# Patient Record
Sex: Female | Born: 2000 | Race: White | Hispanic: No | Marital: Married | Smoking: Never smoker
Health system: Southern US, Community
[De-identification: ages and names within clinical notes are randomized; demographics above are authoritative.]

## PROBLEM LIST (undated history)

## (undated) DIAGNOSIS — Z789 Other specified health status: Secondary | ICD-10-CM

## (undated) HISTORY — PX: APPENDECTOMY: SHX54

---

## 2019-04-29 ENCOUNTER — Encounter (HOSPITAL_COMMUNITY): Payer: Self-pay | Admitting: *Deleted

## 2019-04-29 ENCOUNTER — Other Ambulatory Visit: Payer: Self-pay

## 2019-04-29 ENCOUNTER — Inpatient Hospital Stay (HOSPITAL_COMMUNITY)
Admission: AD | Admit: 2019-04-29 | Discharge: 2019-04-29 | Disposition: A | Discharge status: Home / Self Care | Attending: Obstetrics & Gynecology | Admitting: Obstetrics & Gynecology

## 2019-04-29 DIAGNOSIS — Z3A08 8 weeks gestation of pregnancy: Secondary | ICD-10-CM | POA: Diagnosis not present

## 2019-04-29 DIAGNOSIS — O219 Vomiting of pregnancy, unspecified: Secondary | ICD-10-CM | POA: Insufficient documentation

## 2019-04-29 HISTORY — DX: Other specified health status: Z78.9

## 2019-04-29 LAB — URINALYSIS, ROUTINE W REFLEX MICROSCOPIC
Bacteria, UA: NONE SEEN
Bilirubin Urine: NEGATIVE
Glucose, UA: NEGATIVE mg/dL
Hgb urine dipstick: NEGATIVE
Ketones, ur: 80 mg/dL — AB
Leukocytes,Ua: NEGATIVE
Nitrite: NEGATIVE
Protein, ur: 30 mg/dL — AB
Specific Gravity, Urine: 1.018 (ref 1.005–1.030)
pH: 7 (ref 5.0–8.0)

## 2019-04-29 LAB — POCT PREGNANCY, URINE: Preg Test, Ur: POSITIVE — AB

## 2019-04-29 MED ORDER — METOCLOPRAMIDE HCL 10 MG PO TABS: 10.0000 mg | ORAL_TABLET | Freq: Four times a day (QID) | ORAL | 1 refills | Status: DC | PRN

## 2019-04-29 MED ORDER — SCOPOLAMINE 1 MG/3DAYS TD PT72: 1.0000 | MEDICATED_PATCH | TRANSDERMAL | 0 refills | Status: DC

## 2019-04-29 MED ORDER — M.V.I. ADULT IV INJ
Freq: Once | INTRAVENOUS | Status: AC
  Administered 2019-04-29: 16:00:00 via INTRAVENOUS
  Filled 2019-04-29: qty 10

## 2019-04-29 MED ORDER — METOCLOPRAMIDE HCL 5 MG/ML IJ SOLN
10.0000 mg | Freq: Once | INTRAMUSCULAR | Status: AC
  Administered 2019-04-29: 10 mg via INTRAVENOUS
  Filled 2019-04-29: qty 2

## 2019-04-29 MED ORDER — FAMOTIDINE IN NACL 20-0.9 MG/50ML-% IV SOLN
20.0000 mg | Freq: Once | INTRAVENOUS | Status: AC
  Administered 2019-04-29: 20 mg via INTRAVENOUS
  Filled 2019-04-29: qty 50

## 2019-04-29 MED ORDER — LACTATED RINGERS IV BOLUS
1000.0000 mL | Freq: Once | INTRAVENOUS | Status: AC
  Administered 2019-04-29: 1000 mL via INTRAVENOUS

## 2019-04-29 MED ORDER — SCOPOLAMINE 1 MG/3DAYS TD PT72
1.0000 | MEDICATED_PATCH | TRANSDERMAL | Status: DC
  Administered 2019-04-29: 1.5 mg via TRANSDERMAL
  Filled 2019-04-29: qty 1

## 2019-04-29 NOTE — Discharge Instructions (Signed)
[place] for [employer] at [facility]       Phone: [phone]  [place] for [employer] at [place]   Phone: [place] for [employer] at [place]  Phone: [place] for [place] at [employer]  Phone: [place] for [place] at [place]  Phone: [place] for [place] at [facility]   Phone: [facility]       Phone: [phone]  [facility]    Phone: [facility]    Phone: [facility]    Phone: [place]    Phone: [phone]  [place] [facility]       Phone: [phone]   [place] [facility]  Phone: [place]    Phone: [phone]  [facility]   Phone: [phone]  [facility]      Phone: [phone]  [facility]    Phone: [phone]  Safe Medications in Pregnancy   Acne: Benzoyl Peroxide Salicylic Acid  Backache/Headache: Tylenol: 2 regular strength every 4 hours OR              2 Extra strength every 6 hours  Colds/Coughs/Allergies: Benadryl (alcohol free) 25 mg every 6 hours as needed Breath right strips Claritin Cepacol throat lozenges Chloraseptic throat spray Cold-Eeze- up to three times per day Cough drops, alcohol free Flonase (by prescription only) Guaifenesin Mucinex Robitussin DM (plain only, alcohol free) Saline nasal spray/drops Sudafed (pseudoephedrine) & Actifed ** use only after [redacted] weeks gestation and if you do not have high blood pressure Tylenol Vicks Vaporub Zinc lozenges Zyrtec   Constipation: Colace Ducolax suppositories Fleet enema Glycerin suppositories Metamucil Milk of  magnesia Miralax Senokot Smooth move tea  Diarrhea: Kaopectate Imodium A-D  *NO pepto Bismol  Hemorrhoids: Anusol Anusol HC Preparation H Tucks  Indigestion: Tums Maalox Mylanta Zantac  Pepcid  Insomnia: Benadryl (alcohol free) 25mg  every 6 hours as needed Tylenol PM Unisom, no Gelcaps  Leg Cramps: Tums MagGel  Nausea/Vomiting:  Bonine Dramamine Emetrol Ginger extract Sea bands Meclizine  Nausea medication to take during pregnancy:  Unisom (doxylamine succinate 25 mg tablets) Take one tablet daily at bedtime. If symptoms are not adequately controlled, the dose can be increased to a maximum recommended dose of two tablets daily (1/2 tablet in the morning, 1/2 tablet mid-afternoon and one at bedtime). Vitamin B6 100mg  tablets. Take one tablet twice a day (up to 200 mg per day).  Skin Rashes: Aveeno products Benadryl cream or 25mg  every 6 hours as needed Calamine Lotion 1% cortisone cream  Yeast infection: Gyne-lotrimin 7 Monistat 7   **If taking multiple medications, please check labels to avoid duplicating the same active ingredients **take medication as directed on the label ** Do not exceed 4000 mg of tylenol in 24 hours **Do not take medications that contain aspirin or ibuprofen

## 2019-04-29 NOTE — MAU Provider Note (Addendum)
History     CSN: [id number]  Arrival date and time: 04/29/19 1309   First Provider Initiated Contact with Patient 04/29/19 1410      Chief Complaint  Patient presents with  . Nausea  . Emesis   Jordan Murray is a 18 y.o. G1P0 at unknown GA. She presents to MAU with complaints of N/V. She reports symptoms have been occurring for the past 1.5 weeks. She reports being unable to keep fluid or food down. Was seen at another hospital last week and told she may be around [redacted] weeks gestation- she is unsure of her LMP. They prescribed zofran and reglan for N/V but she ran out of medication. She reports reglan worked but zofran did not. She denies abdominal pain, vaginal bleeding or vaginal discharge.    OB History   No obstetric history on file.     Past Medical History:  Diagnosis Date  . Medical history non-contributory     Past Surgical History:  Procedure Laterality Date  . APPENDECTOMY      History reviewed. No pertinent family history.  Social History   Tobacco Use  . Smoking status: Never Smoker  . Smokeless tobacco: Former Engineer, water Use Topics  . Alcohol use: Not Currently  . Drug use: Not Currently    Allergies: No Known Allergies  No medications prior to admission.    Review of Systems  Constitutional: Negative.   Respiratory: Negative.   Cardiovascular: Negative.   Gastrointestinal: Positive for nausea and vomiting. Negative for abdominal pain, constipation and diarrhea.  Genitourinary: Negative.   Musculoskeletal: Negative.   Neurological: Negative.    Physical Exam   Blood pressure 121/84, pulse (!) 107, temperature 98.1 F (36.7 C), temperature source Oral, resp. rate 16, height 5\' 4"  (1.626 m), weight 61.6 kg, SpO2 98 %.  Physical Exam  Nursing note and vitals reviewed. Constitutional: She is oriented to person, place, and time. She appears well-developed and well-nourished. No distress.  Cardiovascular: Normal rate, regular rhythm and  normal heart sounds.  Respiratory: Effort normal and breath sounds normal. No respiratory distress. She has no wheezes.  GI: Soft. She exhibits no distension. There is no abdominal tenderness. There is no rebound and no guarding.  Musculoskeletal: Normal range of motion.        General: No edema.  Neurological: She is alert and oriented to person, place, and time.  Skin: Skin is warm and dry.  Psychiatric: She has a normal mood and affect. Her behavior is normal. Thought content normal.   MAU Course  Procedures  MDM Orders Placed This Encounter  Procedures  . Urinalysis, Routine w reflex microscopic  . Pregnancy, urine POC  . Insert peripheral IV   Meds ordered this encounter  Medications  . lactated ringers bolus 1,000 mL  . multivitamins adult (INFUVITE ADULT) 10 mL in lactated ringers 1,000 mL infusion  . scopolamine (TRANSDERM-SCOP) 1 MG/3DAYS 1.5 mg  . metoCLOPramide (REGLAN) injection 10 mg  . famotidine (PEPCID) IVPB 20 mg premix   Labs reviewed:  Results for orders placed or performed during the hospital encounter of 04/29/19 (from the past 24 hour(s))  Urinalysis, Routine w reflex microscopic     Status: Abnormal   Collection Time: 04/29/19  1:50 PM  Result Value Ref Range   Color, Urine AMBER (A) YELLOW   APPearance CLOUDY (A) CLEAR   Specific Gravity, Urine 1.018 1.005 - 1.030   pH 7.0 5.0 - 8.0   Glucose, UA NEGATIVE NEGATIVE mg/dL  Hgb urine dipstick NEGATIVE NEGATIVE   Bilirubin Urine NEGATIVE NEGATIVE   Ketones, ur 80 (A) NEGATIVE mg/dL   Protein, ur 30 (A) NEGATIVE mg/dL   Nitrite NEGATIVE NEGATIVE   Leukocytes,Ua NEGATIVE NEGATIVE   RBC / HPF 0-5 0 - 5 RBC/hpf   Bacteria, UA NONE SEEN NONE SEEN   Squamous Epithelial / LPF 6-10 0 - 5   Mucus PRESENT   Pregnancy, urine POC     Status: Abnormal   Collection Time: 04/29/19  1:50 PM  Result Value Ref Range   Preg Test, Ur POSITIVE (A) NEGATIVE   Treatments in MAU included IV LR bolus, pepcid IV, reglan  and banana bag IV. Scope patch also placed.   PO challenge passed after completion of treatment in MAU  Bedside US performed:  Pt informed that the ultrasound is considered a limited OB ultrasound and is not intended to be a complete ultrasound exam.  Patient also informed that the ultrasound is not being completed with the intent of assessing for fetal or placental anomalies or any pelvic abnormalities.  Explained that the purpose of today's ultrasound is to assess for  viability .  Patient acknowledges the purpose of the exam and the limitations of the study.    FHR 160 by bedside US  CRL 1.63 measuring [redacted]w[redacted]d   Rx for reglan and scope patch sent to pharmacy of choice, Discussed reasons to return to MAU. Encouraged to make appointment for initial prenatal care. Pt stable at time of discharge.   Assessment and Plan   1. Nausea and vomiting during pregnancy prior to [redacted] weeks gestation    Discharge home Make initial prenatal appointment Return to MAU as needed Rx for Reglan and Scope patch     Allergies as of 04/29/2019   No Known Allergies     Medication List    TAKE these medications   metoCLOPramide 10 MG tablet Commonly known as: REGLAN Take 1 tablet (10 mg total) by mouth every 6 (six) hours as needed for nausea.   scopolamine 1 MG/3DAYS Commonly known as: TRANSDERM-SCOP Place 1 patch (1.5 mg total) onto the skin every 3 (three) days. Start taking on: May 02, 2019       Lajean Manes [facility] 04/29/2019, 8:10 PM

## 2019-04-29 NOTE — MAU Note (Signed)
Jordan Murray is a 18 y.o. here in MAU reporting: + UPT last week at [facility]. States she has been to the hospital twice for dehydration. States she cannot keep anything. Was given zofran and reglan for nausea but is out of those medications. Unable to count the number of times she has thrown up in the past 24 hours. Has not started [facility] yet. No abdominal pain, vaginal bleeding, or discharge.   LMP: unknown, states she was on the 3 month OCP and did not get a period  Onset of complaint: ongoing  Pain score: 0/10  Vitals:   04/29/19 1342  BP: 121/84  Pulse: (!) 107  Resp: 16  Temp: 98.1 F (36.7 C)  SpO2: 98%      Lab orders placed from triage: UA, UPT

## 2019-05-23 ENCOUNTER — Encounter (HOSPITAL_COMMUNITY): Payer: Self-pay | Admitting: *Deleted

## 2019-05-23 ENCOUNTER — Inpatient Hospital Stay (HOSPITAL_COMMUNITY)

## 2019-05-23 ENCOUNTER — Inpatient Hospital Stay (HOSPITAL_COMMUNITY)
Admission: AD | Admit: 2019-05-23 | Discharge: 2019-05-26 | DRG: 833 | Disposition: A | Discharge status: Home / Self Care | Attending: Obstetrics & Gynecology | Admitting: Obstetrics & Gynecology

## 2019-05-23 ENCOUNTER — Other Ambulatory Visit: Payer: Self-pay

## 2019-05-23 DIAGNOSIS — O211 Hyperemesis gravidarum with metabolic disturbance: Secondary | ICD-10-CM | POA: Diagnosis present

## 2019-05-23 DIAGNOSIS — Z20828 Contact with and (suspected) exposure to other viral communicable diseases: Secondary | ICD-10-CM | POA: Diagnosis present

## 2019-05-23 DIAGNOSIS — Z3A11 11 weeks gestation of pregnancy: Secondary | ICD-10-CM | POA: Diagnosis not present

## 2019-05-23 DIAGNOSIS — O283 Abnormal ultrasonic finding on antenatal screening of mother: Secondary | ICD-10-CM

## 2019-05-23 DIAGNOSIS — E860 Dehydration: Secondary | ICD-10-CM | POA: Diagnosis present

## 2019-05-23 DIAGNOSIS — E871 Hypo-osmolality and hyponatremia: Secondary | ICD-10-CM | POA: Diagnosis present

## 2019-05-23 DIAGNOSIS — O210 Mild hyperemesis gravidarum: Secondary | ICD-10-CM | POA: Diagnosis present

## 2019-05-23 DIAGNOSIS — E876 Hypokalemia: Secondary | ICD-10-CM | POA: Diagnosis present

## 2019-05-23 DIAGNOSIS — R17 Unspecified jaundice: Secondary | ICD-10-CM | POA: Diagnosis present

## 2019-05-23 DIAGNOSIS — R7989 Other specified abnormal findings of blood chemistry: Secondary | ICD-10-CM | POA: Diagnosis present

## 2019-05-23 DIAGNOSIS — Z3682 Encounter for antenatal screening for nuchal translucency: Secondary | ICD-10-CM

## 2019-05-23 LAB — URINALYSIS, COMPLETE (UACMP) WITH MICROSCOPIC
Glucose, UA: NEGATIVE mg/dL
Hgb urine dipstick: NEGATIVE
Ketones, ur: 80 mg/dL — AB
Leukocytes,Ua: NEGATIVE
Nitrite: NEGATIVE
Protein, ur: 100 mg/dL — AB
Specific Gravity, Urine: 1.025 (ref 1.005–1.030)
pH: 6 (ref 5.0–8.0)

## 2019-05-23 LAB — COMPREHENSIVE METABOLIC PANEL
ALT: 83 U/L — ABNORMAL HIGH (ref 0–44)
AST: 58 U/L — ABNORMAL HIGH (ref 15–41)
Albumin: 4.1 g/dL (ref 3.5–5.0)
Alkaline Phosphatase: 65 U/L (ref 38–126)
Anion gap: 22 — ABNORMAL HIGH (ref 5–15)
BUN: 8 mg/dL (ref 6–20)
CO2: 24 mmol/L (ref 22–32)
Calcium: 9.5 mg/dL (ref 8.9–10.3)
Chloride: 80 mmol/L — ABNORMAL LOW (ref 98–111)
Creatinine, Ser: 0.67 mg/dL (ref 0.44–1.00)
GFR calc Af Amer: >60 mL/min (ref >60)
GFR calc non Af Amer: >60 mL/min (ref >60)
Glucose, Bld: 101 mg/dL — ABNORMAL HIGH (ref 70–99)
Potassium: 2.4 mmol/L — CL (ref 3.5–5.1)
Sodium: 126 mmol/L — ABNORMAL LOW (ref 135–145)
Total Bilirubin: 9.1 mg/dL — ABNORMAL HIGH (ref 0.3–1.2)
Total Protein: 7 g/dL (ref 6.5–8.1)

## 2019-05-23 LAB — BLOOD GAS, VENOUS
Acid-Base Excess: 5.5 mmol/L — ABNORMAL HIGH (ref 0.0–2.0)
Bicarbonate: 28.7 mmol/L — ABNORMAL HIGH (ref 20.0–28.0)
Drawn by: 332341
FIO2: 0.21
pCO2, Ven: 38.1 mmHg — ABNORMAL LOW (ref 44.0–60.0)
pH, Ven: 7.49 — ABNORMAL HIGH (ref 7.250–7.430)
pO2, Ven: 34.6 mmHg (ref 32.0–45.0)

## 2019-05-23 LAB — CBC
HCT: 50.2 % — ABNORMAL HIGH (ref 36.0–46.0)
Hemoglobin: 18.8 g/dL — ABNORMAL HIGH (ref 12.0–15.0)
MCH: 29.7 pg (ref 26.0–34.0)
MCHC: 37.5 g/dL — ABNORMAL HIGH (ref 30.0–36.0)
MCV: 79.4 fL — ABNORMAL LOW (ref 80.0–100.0)
Platelets: 256 10*3/uL (ref 150–400)
RBC: 6.32 MIL/uL — ABNORMAL HIGH (ref 3.87–5.11)
RDW: 12.2 % (ref 11.5–15.5)
WBC: 15 10*3/uL — ABNORMAL HIGH (ref 4.0–10.5)
nRBC: 0.1 % (ref 0.0–0.2)

## 2019-05-23 LAB — BILIRUBIN, FRACTIONATED(TOT/DIR/INDIR)
Bilirubin, Direct: 2.9 mg/dL — ABNORMAL HIGH (ref 0.0–0.2)
Indirect Bilirubin: 6.3 mg/dL — ABNORMAL HIGH (ref 0.3–0.9)
Total Bilirubin: 9.2 mg/dL — ABNORMAL HIGH (ref 0.3–1.2)

## 2019-05-23 LAB — HEPATITIS PANEL, ACUTE
HCV Ab: NONREACTIVE
Hep A IgM: NONREACTIVE
Hep B C IgM: NONREACTIVE
Hepatitis B Surface Ag: NONREACTIVE

## 2019-05-23 LAB — SARS CORONAVIRUS 2 BY RT PCR (HOSPITAL ORDER, PERFORMED IN ~~LOC~~ HOSPITAL LAB): SARS Coronavirus 2: NEGATIVE

## 2019-05-23 MED ORDER — ONDANSETRON HCL 4 MG/2ML IJ SOLN
4.0000 mg | Freq: Four times a day (QID) | INTRAMUSCULAR | Status: DC | PRN
  Administered 2019-05-26: 4 mg via INTRAVENOUS
  Filled 2019-05-23: qty 2

## 2019-05-23 MED ORDER — THIAMINE HCL 100 MG/ML IJ SOLN
100.0000 mg | Freq: Every day | INTRAMUSCULAR | Status: DC
  Administered 2019-05-24 – 2019-05-25 (×3): 100 mg via INTRAVENOUS
  Filled 2019-05-23 (×6): qty 1

## 2019-05-23 MED ORDER — FOLIC ACID 5 MG/ML IJ SOLN
1.0000 mg | Freq: Every day | INTRAMUSCULAR | Status: DC
  Administered 2019-05-23 – 2019-05-25 (×3): 1 mg via INTRAVENOUS
  Filled 2019-05-23 (×4): qty 0.2

## 2019-05-23 MED ORDER — PROMETHAZINE HCL 25 MG/ML IJ SOLN
25.0000 mg | Freq: Once | INTRAMUSCULAR | Status: AC
  Administered 2019-05-23: 19:00:00 25 mg via INTRAVENOUS
  Filled 2019-05-23: qty 1

## 2019-05-23 MED ORDER — METOCLOPRAMIDE HCL 5 MG/ML IJ SOLN
10.0000 mg | Freq: Four times a day (QID) | INTRAMUSCULAR | Status: DC
  Administered 2019-05-24 – 2019-05-26 (×10): 10 mg via INTRAVENOUS
  Filled 2019-05-23 (×10): qty 2

## 2019-05-23 MED ORDER — LACTATED RINGERS IV BOLUS
1000.0000 mL | Freq: Once | INTRAVENOUS | Status: AC
  Administered 2019-05-23: 1000 mL via INTRAVENOUS

## 2019-05-23 MED ORDER — DEXTROSE-NACL 5-0.9 % IV SOLN
INTRAVENOUS | Status: DC
  Administered 2019-05-23 – 2019-05-26 (×5): via INTRAVENOUS

## 2019-05-23 MED ORDER — POTASSIUM CHLORIDE 10 MEQ/100ML IV SOLN
10.0000 meq | INTRAVENOUS | Status: AC
  Administered 2019-05-23 – 2019-05-24 (×4): 10 meq via INTRAVENOUS
  Filled 2019-05-23 (×5): qty 100

## 2019-05-23 MED ORDER — ONDANSETRON HCL 4 MG PO TABS: 4.0000 mg | ORAL_TABLET | Freq: Four times a day (QID) | ORAL | Status: DC | PRN

## 2019-05-23 NOTE — Progress Notes (Signed)
ABG ordered for patient.  RT stuck patient once Left Radial with no blood return.  RT stuck again Right Brachial.  Blood return was present but slow to fill syringe and dark in color.  Ran blood on analyzer.  Results appeared to be venous.  Called provider to notify them.  Order was changed to a venous order.  RT entered results into computer under venous sample order.

## 2019-05-23 NOTE — MAU Provider Note (Addendum)
History     CSN: [id number]  Arrival date and time: 05/23/19 1829   First Provider Initiated Contact with Patient 05/23/19 1902      Chief Complaint  Patient presents with  . Morning Sickness   HPI Jordan Murray is a a 18 y.o. G1P0 at [redacted]w[redacted]d who presents with persistent nausea and vomiting. Mother is the historian. Reports sickness all day for about 2 months. Originally was seen in [place] for N/V which was where she discovered she was pregnant. At this visit she as given Reglan and Zofran. Pt was seen here on 9/26 for the same complaint and was given reglan and scope patch. Mother states she felt good for about 2 days, but after that it provided minimal help. Reports she is drinking enough fluids to urinate, but it is very dark. Mother also notes scleral icterus. Has been sticking to the [employer] and drinking water but cannot keep it down.   Positive for fatigue Denies vaginal bleeding, discharge or abdominal pain  OB History    Gravida  1   Para      Term      Preterm      AB      Living        SAB      TAB      Ectopic      Multiple      Live Births              Past Medical History:  Diagnosis Date  . Medical history non-contributory     Past Surgical History:  Procedure Laterality Date  . APPENDECTOMY      History reviewed. No pertinent family history.  Social History   Tobacco Use  . Smoking status: Never Smoker  . Smokeless tobacco: Former Network engineer Use Topics  . Alcohol use: Not Currently  . Drug use: Not Currently    Allergies: No Known Allergies  Medications Prior to Admission  Medication Sig Dispense Refill Last Dose  . metoCLOPramide (REGLAN) 10 MG tablet Take 1 tablet (10 mg total) by mouth every 6 (six) hours as needed for nausea. 30 tablet 1 Past Week at Unknown time  . scopolamine (TRANSDERM-SCOP) 1 MG/3DAYS Place 1 patch (1.5 mg total) onto the skin every 3 (three) days. 10 patch 0 05/23/2019 at Unknown time     Review of Systems  Constitutional: Positive for fatigue. Negative for chills and fever (pt too lethargic to provide history).  Gastrointestinal: Positive for nausea and vomiting. Negative for abdominal pain, constipation and diarrhea.  Genitourinary: Negative for vaginal bleeding and vaginal discharge.   Physical Exam   Blood pressure (!) 130/92, pulse (!) 118, temperature 98.2 F (36.8 C), resp. rate 16, weight 54 kg, SpO2 100 %.  Physical Exam  Constitutional: She is oriented to person, place, and time. She appears well-developed and well-nourished.  HENT:  Head: Normocephalic.  Eyes: Conjunctivae are normal. Scleral icterus is present.  Neck: Normal range of motion.  Respiratory: Effort normal.  GI: Soft. Normal appearance. There is no abdominal tenderness.  Neurological: She is alert and oriented to person, place, and time.  Skin: Skin is warm and dry.   Results for orders placed or performed during the hospital encounter of 05/23/19 (from the past 24 hour(s))  Comprehensive metabolic panel     Status: Abnormal   Collection Time: 05/23/19  6:36 PM  Result Value Ref Range   Sodium 126 (L) 135 - 145 mmol/L   Potassium  2.4 (LL) 3.5 - 5.1 mmol/L   Chloride 80 (L) 98 - 111 mmol/L   CO2 24 22 - 32 mmol/L   Glucose, Bld 101 (H) 70 - 99 mg/dL   BUN 8 6 - 20 mg/dL   Creatinine, Ser 8.65 0.44 - 1.00 mg/dL   Calcium 9.5 8.9 - 78.4 mg/dL   Total Protein 7.0 6.5 - 8.1 g/dL   Albumin 4.1 3.5 - 5.0 g/dL   AST 58 (H) 15 - 41 U/L   ALT 83 (H) 0 - 44 U/L   Alkaline Phosphatase 65 38 - 126 U/L   Total Bilirubin 9.1 (H) 0.3 - 1.2 mg/dL   GFR calc non Af Amer >60 >60 mL/min   GFR calc Af Amer >60 >60 mL/min   Anion gap 22 (H) 5 - 15    MAU Course  Procedures  MDM  Assessment and Plan    Jordan Murray 05/23/2019, 7:10 PM   I confirm that I have verified the information documented in the physician assistant student's note and that I have also personally reperformed the  history, physical exam and all medical decision making activities of this service and have verified that all service and findings are accurately documented in this student's note.   -Consult with Dr. Adrian Blackwater, plan for admission and close observation.   Marylene Land, CNM 05/23/2019 8:08 PM

## 2019-05-23 NOTE — H&P (Signed)
ANTEPARTUM ADMISSION HISTORY AND PHYSICAL NOTE   History of Present Illness: Jordan Murray is a 18 y.o. G1P0 at [redacted]w[redacted]d admitted for dehydration, hypokalemia, hyponatremia with elevated LFTs and new-onset of sclera icterus. She was seen at a hospital in [place], [place] ([place]) early in her pregnancy and given Zofran for nausea. It did not help. She then moved to the [place] area in September and was seen in MAU on 9-29 for nausea and vomiting of pregnancy; she was D/Ced with reglan and scop patch. Since then, she has been unable to keep any food down and is "only drinking enough to urinate". Her mother reports 40 lb weight loss in the past 2 months as well as daughter's increasing lethargy. She has an appt at [facility] next week to start prenatal care.   Patient denies vaginal bleeding, vaginal discharge, dysuria, SOB, fever or other ob-gyn complaints.   Patient Active Problem List   Diagnosis Date Noted  . Hypokalemia 05/23/2019  . Hyperemesis gravidarum 05/23/2019  . Elevated LFTs 05/23/2019  . 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 Dehydration 05/23/2019  . Hyponatremia 05/23/2019      Patient reports the fetal movement as not feeling movements. . Patient reports uterine contraction  activity as none.  Patient reports  vaginal bleeding as none. Patient describes fluid per vagina as none.  Fetal presentation is NA.  Patient Active Problem List   Diagnosis Date Noted  . Hypokalemia 05/23/2019  . Hyperemesis gravidarum 05/23/2019  . Elevated LFTs 05/23/2019  . 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 Dehydration 05/23/2019  . Hyponatremia 05/23/2019    Past Medical History:  Diagnosis Date  . Medical history non-contributory     Past Surgical History:  Procedure Laterality Date  . APPENDECTOMY      OB History  Gravida Para Term Preterm AB Living  1            SAB TAB Ectopic Multiple Live Births               # Outcome Date GA Lbr Len/2nd Weight Sex Delivery Anes PTL Lv   1 Current             Social History   Socioeconomic History  . Marital status: Single    Spouse name: Not on file  . Number of children: Not on file  . Years of education: Not on file  . Highest education level: Not on file  Occupational History  . Not on file  Social Needs  . Financial resource strain: Not on file  . Food insecurity    Worry: Not on file    Inability: Not on file  . Transportation needs    Medical: Not on file    Non-medical: Not on file  Tobacco Use  . Smoking status: Never Smoker  . Smokeless tobacco: Former Network engineer and Sexual Activity  . Alcohol use: Not Currently  . Drug use: Not Currently  . Sexual activity: Not Currently    Birth control/protection: None  Lifestyle  . Physical activity    Days per week: Not on file    Minutes per session: Not on file  . Stress: Not on file  Relationships  . Social Herbalist on phone: Not on file    Gets together: Not on file    Attends religious service: Not on file    Active member of club or organization: Not on file    Attends meetings of clubs or organizations: Not on  file    Relationship status: Not on file  Other Topics Concern  . Not on file  Social History Narrative  . Not on file    History reviewed. No pertinent family history.  No Known Allergies  Medications Prior to Admission  Medication Sig Dispense Refill Last Dose  . metoCLOPramide (REGLAN) 10 MG tablet Take 1 tablet (10 mg total) by mouth every 6 (six) hours as needed for nausea. 30 tablet 1 Past Week at Unknown time  . scopolamine (TRANSDERM-SCOP) 1 MG/3DAYS Place 1 patch (1.5 mg total) onto the skin every 3 (three) days. 10 patch 0 05/23/2019 at Unknown time    Review of Systems - as follows  Vitals:  BP (!) 130/92   Pulse (!) 118   Temp 98.2 F (36.8 C)   Resp 16   Wt 54 kg   LMP  (LMP Unknown)   SpO2 100%   BMI 20.43 kg/m  Physical Examination: CONSTITUTIONAL: weak-appearing, lethargic. Alert and  oriented x 3.  HENT:  Normocephalic, atraumatic, External right and left ear normal. Oropharynx is clear and moist EYES: Conjunctivae and EOM are normal. Pupils are equal, round, and reactive to light. Positive scleral icterus.  NECK: Normal range of motion, supple, no masses SKIN: Skin is warm and dry. No rash noted. Not diaphoretic. No erythema. No pallor. NEUROLGIC: Alert and oriented to person, place, and time.  muscle tone coordination. No cranial nerve deficit noted. PSYCHIATRIC: Normal mood and affect. Normal behavior. Normal judgment and thought content. CARDIOVASCULAR: Normal heart rate noted, regular rhythm RESPIRATORY: Effort and breath sounds normal, no problems with respiration noted ABDOMEN: Soft, nontender, nondistended, gravid; tenderness of RUQ MUSCULOSKELETAL: Normal range of motion. No edema and no tenderness. 2+ distal pulses.  Cervix: Not evaluated. and found to be not evaluated/ /NA and fetal presentation is NA. Membranes:intact, NA Fetal Monitoring:NA Tocometer:   Labs:  Results for orders placed or performed during the hospital encounter of 05/23/19 (from the past 24 hour(s))  CBC   Collection Time: 05/23/19  6:36 PM  Result Value Ref Range   WBC 15.0 (H) 4.0 - 10.5 K/uL   RBC 6.32 (H) 3.87 - 5.11 MIL/uL   Hemoglobin 18.8 (H) 12.0 - 15.0 g/dL   HCT 34.3 (H) 56.8 - 61.6 %   MCV 79.4 (L) 80.0 - 100.0 fL   MCH 29.7 26.0 - 34.0 pg   MCHC 37.5 (H) 30.0 - 36.0 g/dL   RDW 83.7 29.0 - 21.1 %   Platelets 256 150 - 400 K/uL   nRBC 0.1 0.0 - 0.2 %  Comprehensive metabolic panel   Collection Time: 05/23/19  6:36 PM  Result Value Ref Range   Sodium 126 (L) 135 - 145 mmol/L   Potassium 2.4 (LL) 3.5 - 5.1 mmol/L   Chloride 80 (L) 98 - 111 mmol/L   CO2 24 22 - 32 mmol/L   Glucose, Bld 101 (H) 70 - 99 mg/dL   BUN 8 6 - 20 mg/dL   Creatinine, Ser 1.55 0.44 - 1.00 mg/dL   Calcium 9.5 8.9 - 20.8 mg/dL   Total Protein 7.0 6.5 - 8.1 g/dL   Albumin 4.1 3.5 - 5.0 g/dL    AST 58 (H) 15 - 41 U/L   ALT 83 (H) 0 - 44 U/L   Alkaline Phosphatase 65 38 - 126 U/L   Total Bilirubin 9.1 (H) 0.3 - 1.2 mg/dL   GFR calc non Af Amer >60 >60 mL/min   GFR calc Af Amer >60 >  60 mL/min   Anion gap 22 (H) 5 - 15  Urinalysis, Complete w Microscopic   Collection Time: 05/23/19  7:45 PM  Result Value Ref Range   Color, Urine AMBER (A) YELLOW   APPearance HAZY (A) CLEAR   Specific Gravity, Urine 1.025 1.005 - 1.030   pH 6.0 5.0 - 8.0   Glucose, UA NEGATIVE NEGATIVE mg/dL   Hgb urine dipstick NEGATIVE NEGATIVE   Bilirubin Urine SMALL (A) NEGATIVE   Ketones, ur 80 (A) NEGATIVE mg/dL   Protein, ur 161100 (A) NEGATIVE mg/dL   Nitrite NEGATIVE NEGATIVE   Leukocytes,Ua NEGATIVE NEGATIVE   RBC / HPF 0-5 0 - 5 RBC/hpf   WBC, UA 0-5 0 - 5 WBC/hpf   Bacteria, UA RARE (A) NONE SEEN   Squamous Epithelial / LPF 0-5 0 - 5   Mucus PRESENT    Hyaline Casts, UA PRESENT     Imaging Studies: No results found.  -RUQ US  Assessment and Plan: Patient Active Problem List   Diagnosis Date Noted  . Hypokalemia 05/23/2019  . Hyperemesis gravidarum 05/23/2019  . Elevated LFTs 05/23/2019  . 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 Dehydration 05/23/2019  . Hyponatremia 05/23/2019   Routine antenatal care -BNP q4, add on fractionated bilirubin -ABGs -HgA1c -replace K+ -recheck LFTs in the am -D5 NS IV at 125 -scheduled Reglan and Zofran PRN   Marylene LandKathryn Lorraine Kooistra, CNM Faculty Practice, [facility]

## 2019-05-24 ENCOUNTER — Inpatient Hospital Stay (HOSPITAL_COMMUNITY)

## 2019-05-24 DIAGNOSIS — Z3A11 11 weeks gestation of pregnancy: Secondary | ICD-10-CM

## 2019-05-24 DIAGNOSIS — O210 Mild hyperemesis gravidarum: Secondary | ICD-10-CM

## 2019-05-24 LAB — COMPREHENSIVE METABOLIC PANEL
ALT: 64 U/L — ABNORMAL HIGH (ref 0–44)
AST: 44 U/L — ABNORMAL HIGH (ref 15–41)
Albumin: 3 g/dL — ABNORMAL LOW (ref 3.5–5.0)
Alkaline Phosphatase: 53 U/L (ref 38–126)
Anion gap: 11 (ref 5–15)
BUN: 5 mg/dL — ABNORMAL LOW (ref 6–20)
CO2: 27 mmol/L (ref 22–32)
Calcium: 8.7 mg/dL — ABNORMAL LOW (ref 8.9–10.3)
Chloride: 91 mmol/L — ABNORMAL LOW (ref 98–111)
Creatinine, Ser: 0.55 mg/dL (ref 0.44–1.00)
GFR calc Af Amer: >60 mL/min (ref >60)
GFR calc non Af Amer: >60 mL/min (ref >60)
Glucose, Bld: 105 mg/dL — ABNORMAL HIGH (ref 70–99)
Potassium: 2.6 mmol/L — CL (ref 3.5–5.1)
Sodium: 129 mmol/L — ABNORMAL LOW (ref 135–145)
Total Bilirubin: 7 mg/dL — ABNORMAL HIGH (ref 0.3–1.2)
Total Protein: 5.2 g/dL — ABNORMAL LOW (ref 6.5–8.1)

## 2019-05-24 LAB — BASIC METABOLIC PANEL
Anion gap: 16 — ABNORMAL HIGH (ref 5–15)
BUN: 5 mg/dL — ABNORMAL LOW (ref 6–20)
CO2: 26 mmol/L (ref 22–32)
Calcium: 8.5 mg/dL — ABNORMAL LOW (ref 8.9–10.3)
Chloride: 87 mmol/L — ABNORMAL LOW (ref 98–111)
Creatinine, Ser: 0.51 mg/dL (ref 0.44–1.00)
GFR calc Af Amer: >60 mL/min (ref >60)
GFR calc non Af Amer: >60 mL/min (ref >60)
Glucose, Bld: 114 mg/dL — ABNORMAL HIGH (ref 70–99)
Potassium: 2.3 mmol/L — CL (ref 3.5–5.1)
Sodium: 129 mmol/L — ABNORMAL LOW (ref 135–145)

## 2019-05-24 LAB — MAGNESIUM: Magnesium: 1.6 mg/dL — ABNORMAL LOW (ref 1.7–2.4)

## 2019-05-24 LAB — BILIRUBIN, DIRECT: Bilirubin, Direct: 2.2 mg/dL — ABNORMAL HIGH (ref 0.0–0.2)

## 2019-05-24 MED ORDER — POTASSIUM CHLORIDE 10 MEQ/100ML IV SOLN
10.0000 meq | Freq: Once | INTRAVENOUS | Status: AC
  Administered 2019-05-24: 10 meq via INTRAVENOUS
  Filled 2019-05-24: qty 100

## 2019-05-24 MED ORDER — POTASSIUM CHLORIDE 10 MEQ/100ML IV SOLN
10.0000 meq | INTRAVENOUS | Status: AC
  Administered 2019-05-24 (×3): 10 meq via INTRAVENOUS
  Filled 2019-05-24 (×3): qty 100

## 2019-05-24 MED ORDER — POTASSIUM CHLORIDE 10 MEQ/100ML IV SOLN
10.0000 meq | INTRAVENOUS | Status: AC
  Administered 2019-05-24: 10 meq via INTRAVENOUS
  Filled 2019-05-24 (×5): qty 100

## 2019-05-24 MED ORDER — MAGNESIUM SULFATE 2 GM/50ML IV SOLN
2.0000 g | Freq: Once | INTRAVENOUS | Status: AC
  Administered 2019-05-24: 2 g via INTRAVENOUS
  Filled 2019-05-24: qty 50

## 2019-05-24 NOTE — Progress Notes (Signed)
CRITICAL VALUE ALERT  Critical Value:  Potassium 2.6  Date & Time Notied:  05/24/2019 at 0620  Provider Notified: Dr Nehemiah Settle  Orders Received/Actions taken:   No new orders at this time. MD to put in new order for potassium

## 2019-05-24 NOTE — Progress Notes (Signed)
FACULTY PRACTICE ANTEPARTUM COMPREHENSIVE PROGRESS NOTE  Jordan Murray is a 18 y.o. G1P0 at [redacted]w[redacted]d who is admitted for HEG.  Estimated Date of Delivery: 12/09/19  Length of Stay:  1 Days. Admitted 05/23/2019  Subjective: Symptoms have improved. Tolerating clear liquids.  She reports no abdominal pain, no bleeding and no loss of fluid per vagina.  Vitals:  Blood pressure (!) 117/52, pulse 87, temperature 98.1 F (36.7 C), temperature source Oral, resp. rate 18, height  (1.626 m), weight 54 kg, SpO2 99 %. Physical Examination: CONSTITUTIONAL: Well-developed, well-nourished female in no acute distress.  HENT:  Normocephalic, atraumatic, External right and left ear normal. Oropharynx is clear and moist EYES: Conjunctivae and EOM are normal. Pupils are equal, round, and reactive to light. No scleral icterus.  NECK: Normal range of motion, supple, no masses SKIN: Skin is warm and dry. No rash noted. Not diaphoretic. No erythema. No pallor. NEUROLGIC: Alert and oriented to person, place, and time. Normal reflexes, muscle tone coordination. No cranial nerve deficit noted. PSYCHIATRIC: Normal mood and affect. Normal behavior. Normal judgment and thought content. CARDIOVASCULAR: Normal heart rate noted, regular rhythm RESPIRATORY: Effort and breath sounds normal, no problems with respiration noted MUSCULOSKELETAL: Normal range of motion. No edema and no tenderness. 2+ distal pulses. ABDOMEN: Soft, nontender, nondistended, gravid. CERVIX:  Deferred  Fetal monitoring: FHR: 180 bpm on MFM scan today (report pending).  Results for orders placed or performed during the hospital encounter of 05/23/19 (from the past 48 hour(s))  CBC     Status: Abnormal   Collection Time: 05/23/19  6:36 PM  Result Value Ref Range   WBC 15.0 (H) 4.0 - 10.5 K/uL   RBC 6.32 (H) 3.87 - 5.11 MIL/uL   Hemoglobin 18.8 (H) 12.0 - 15.0 g/dL   HCT 14.7 (H) 82.9 - 56.2 %   MCV 79.4 (L) 80.0 - 100.0 fL   MCH 29.7 26.0 -  34.0 pg   MCHC 37.5 (H) 30.0 - 36.0 g/dL    Comment: CORRECTED FOR ICTERUS   RDW 12.2 11.5 - 15.5 %   Platelets 256 150 - 400 K/uL   nRBC 0.1 0.0 - 0.2 %    Comment: Performed at [facility], [address], [place], [place] [address]  Comprehensive metabolic panel     Status: Abnormal   Collection Time: 05/23/19  6:36 PM  Result Value Ref Range   Sodium 126 (L) 135 - 145 mmol/L   Potassium 2.4 (LL) 3.5 - 5.1 mmol/L    Comment: CRITICAL RESULT CALLED TO, READ BACK BY AND VERIFIED WITH: A.CIOCE RN 1931 05/23/2019 MCCORMICK K    Chloride 80 (L) 98 - 111 mmol/L   CO2 24 22 - 32 mmol/L   Glucose, Bld 101 (H) 70 - 99 mg/dL   BUN 8 6 - 20 mg/dL   Creatinine, Ser 5.78 0.44 - 1.00 mg/dL   Calcium 9.5 8.9 - 46.9 mg/dL   Total Protein 7.0 6.5 - 8.1 g/dL   Albumin 4.1 3.5 - 5.0 g/dL   AST 58 (H) 15 - 41 U/L   ALT 83 (H) 0 - 44 U/L   Alkaline Phosphatase 65 38 - 126 U/L   Total Bilirubin 9.1 (H) 0.3 - 1.2 mg/dL   GFR calc non Af Amer >60 >60 mL/min   GFR calc Af Amer >60 >60 mL/min   Anion gap 22 (H) 5 - 15    Comment: Performed at [facility], [address] [place],  [place] [id number]  Urinalysis, Complete w Microscopic     Status: Abnormal   Collection Time: 05/23/19  7:45 PM  Result Value Ref Range   Color, Urine AMBER (A) YELLOW    Comment: BIOCHEMICALS MAY BE AFFECTED BY COLOR   APPearance HAZY (A) CLEAR   Specific Gravity, Urine 1.025 1.005 - 1.030   pH 6.0 5.0 - 8.0   Glucose, UA NEGATIVE NEGATIVE mg/dL   Hgb urine dipstick NEGATIVE NEGATIVE   Bilirubin Urine SMALL (A) NEGATIVE   Ketones, ur 80 (A) NEGATIVE mg/dL   Protein, ur 604100 (A) NEGATIVE mg/dL   Nitrite NEGATIVE NEGATIVE   Leukocytes,Ua NEGATIVE NEGATIVE   RBC / HPF 0-5 0 - 5 RBC/hpf   WBC, UA 0-5 0 - 5 WBC/hpf   Bacteria, UA RARE (A) NONE SEEN   Squamous Epithelial / LPF 0-5 0 - 5   Mucus PRESENT    Hyaline Casts, UA PRESENT     Comment: Performed at [facility], [address]  [place], [place] [address]  Bilirubin, fractionated(tot/dir/indir)     Status: Abnormal   Collection Time: 05/23/19  7:45 PM  Result Value Ref Range   Total Bilirubin 9.2 (H) 0.3 - 1.2 mg/dL   Bilirubin, Direct 2.9 (H) 0.0 - 0.2 mg/dL   Indirect Bilirubin 6.3 (H) 0.3 - 0.9 mg/dL    Comment: Performed at [facility], [address] [place], [place] [address]  Hepatitis panel, acute     Status: None   Collection Time: 05/23/19  7:45 PM  Result Value Ref Range   Hepatitis B Surface Ag NON REACTIVE NON REACTIVE   HCV Ab NON REACTIVE NON REACTIVE    Comment: (NOTE) Nonreactive HCV antibody screen is consistent with no HCV infections,  unless recent infection is suspected or other evidence exists to indicate HCV infection.    Hep A IgM NON REACTIVE NON REACTIVE   Hep B C IgM NON REACTIVE NON REACTIVE    Comment: Performed at [facility], [address] [place], [place] [address]  SARS Coronavirus 2 by RT PCR (hospital order, performed in [facility] hospital lab) Nasopharyngeal Nasopharyngeal Swab     Status: None   Collection Time: 05/23/19  8:10 PM   Specimen: Nasopharyngeal Swab  Result Value Ref Range   SARS Coronavirus 2 NEGATIVE NEGATIVE    Comment: (NOTE) If result is NEGATIVE SARS-CoV-2 target nucleic acids are NOT DETECTED. The SARS-CoV-2 RNA is generally detectable in upper and lower  respiratory specimens during the acute phase of infection. The lowest  concentration of SARS-CoV-2 viral copies this assay can detect is 250  copies / mL. A negative result does not preclude SARS-CoV-2 infection  and should not be used as the sole basis for treatment or other  patient management decisions.  A negative result may occur with  improper specimen collection / handling, submission of specimen other  than nasopharyngeal swab, presence of viral mutation(s) within the  areas targeted by this assay, and inadequate number of viral copies  (<250 copies / mL). A negative result  must be combined with clinical  observations, patient history, and epidemiological information. If result is POSITIVE SARS-CoV-2 target nucleic acids are DETECTED. The SARS-CoV-2 RNA is generally detectable in upper and lower  respiratory specimens dur ing the acute phase of infection.  Positive  results are indicative of active infection with SARS-CoV-2.  Clinical  correlation with patient history and other diagnostic information is  necessary to determine  patient infection status.  Positive results do  not rule out bacterial infection or co-infection with other viruses. If result is PRESUMPTIVE POSTIVE SARS-CoV-2 nucleic acids MAY BE PRESENT.   A presumptive positive result was obtained on the submitted specimen  and confirmed on repeat testing.  While 2019 novel coronavirus  (SARS-CoV-2) nucleic acids may be present in the submitted sample  additional confirmatory testing may be necessary for epidemiological  and / or clinical management purposes  to differentiate between  SARS-CoV-2 and other Sarbecovirus currently known to infect humans.  If clinically indicated additional testing with an alternate test  methodology [phone]) is advised. The SARS-CoV-2 RNA is generally  detectable in upper and lower respiratory sp ecimens during the acute  phase of infection. The expected result is Negative. Fact Sheet for Patients:  BoilerBrush.com.cy Fact Sheet for Healthcare Providers: https://pope.com/ This test is not yet approved or cleared by the [place] FDA and has been authorized for detection and/or diagnosis of SARS-CoV-2 by FDA under an Emergency Use Authorization (EUA).  This EUA will remain in effect (meaning this test can be used) for the duration of the COVID-19 declaration under Section 564(b)(1) of the Act, 21 U.S.C. section 360bbb-3(b)(1), unless the authorization is terminated or revoked sooner. Performed at [facility], [address], [place], [place] [address]   Blood gas, venous     Status: Abnormal   Collection Time: 05/23/19  8:50 PM  Result Value Ref Range   FIO2 0.21    Delivery systems ROOM AIR    pH, Ven 7.490 (H) 7.250 - 7.430   pCO2, Ven 38.1 (L) 44.0 - 60.0 mmHg   pO2, Ven 34.6 32.0 - 45.0 mmHg   Bicarbonate 28.7 (H) 20.0 - 28.0 mmol/L   Acid-Base Excess 5.5 (H) 0.0 - 2.0 mmol/L   Collection site RIGHT BRACHIAL    Drawn by [id number]    Sample type VENOUS   Basic metabolic panel     Status: Abnormal   Collection Time: 05/24/19 12:00 AM  Result Value Ref Range   Sodium 129 (L) 135 - 145 mmol/L   Potassium 2.3 (LL) 3.5 - 5.1 mmol/L    Comment: CRITICAL RESULT CALLED TO, READ BACK BY AND VERIFIED WITH: RN C STOTES  05/24/19 BY S GEZAHEGN    Chloride 87 (L) 98 - 111 mmol/L   CO2 26 22 - 32 mmol/L   Glucose, Bld 114 (H) 70 - 99 mg/dL   BUN 5 (L) 6 - 20 mg/dL   Creatinine, Ser 7.82 0.44 - 1.00 mg/dL   Calcium 8.5 (L) 8.9 - 10.3 mg/dL   GFR calc non Af Amer >60 >60 mL/min   GFR calc Af Amer >60 >60 mL/min   Anion gap 16 (H) 5 - 15    Comment: Performed at [facility], [address], [place], [place] [address]  Comprehensive metabolic panel     Status: Abnormal   Collection Time: 05/24/19  4:56 AM  Result Value Ref Range   Sodium 129 (L) 135 - 145 mmol/L   Potassium 2.6 (LL) 3.5 - 5.1 mmol/L    Comment: CRITICAL RESULT CALLED TO, READ BACK BY AND VERIFIED WITH: K.STOKES,RN 05/24/2019 0619 DAVISB    Chloride 91 (L) 98 - 111 mmol/L   CO2 27 22 - 32 mmol/L   Glucose, Bld 105 (H) 70 - 99 mg/dL   BUN 5 (L) 6 - 20 mg/dL   Creatinine, Ser 3.08 0.44 - 1.00 mg/dL  Calcium 8.7 (L) 8.9 - 10.3 mg/dL   Total Protein 5.2 (L) 6.5 - 8.1 g/dL   Albumin 3.0 (L) 3.5 - 5.0 g/dL   AST 44 (H) 15 - 41 U/L   ALT 64 (H) 0 - 44 U/L   Alkaline Phosphatase 53 38 - 126 U/L   Total Bilirubin 7.0 (H) 0.3 - 1.2 mg/dL   GFR calc non Af Amer >60 >60 mL/min   GFR calc Af Amer >60 >60  mL/min   Anion gap 11 5 - 15    Comment: Performed at [facility] [address] [place], [place] [address]  Bilirubin, direct     Status: Abnormal   Collection Time: 05/24/19  4:56 AM  Result Value Ref Range   Bilirubin, Direct 2.2 (H) 0.0 - 0.2 mg/dL    Comment: Performed at [facility] [address] [place], [place] [address]  Magnesium     Status: Abnormal   Collection Time: 05/24/19  4:56 AM  Result Value Ref Range   Magnesium 1.6 (L) 1.7 - 2.4 mg/dL    Comment: Performed at [facility] [address] [place], [place] [address]    [place] Mfm Ob Limited  Result Date: 05/24/2019 ----------------------------------------------------------------------  OBSTETRICS REPORT                       (Signed Final 05/24/2019 10:22 am) ---------------------------------------------------------------------- Patient Info  ID #:       [id number]                          D.O.B.:  04-07-2001 (18 yrs)  Name:       Jordan Murray                   Visit Date: 05/24/2019 09:12 am ---------------------------------------------------------------------- Performed By  Performed By:     Hubert Azure          Ref. Address:     [address]                    [place], [place]  Attending:        Tama High MD        [facility]  Referred By:      Tereso Newcomer MD ---------------------------------------------------------------------- Orders   #  Description                          Code         Ordered By   1  [place] MFM OB LIMITED                    [address].60     Jaynie Collins  ----------------------------------------------------------------------   #  Order #                    Accession #                  Episode #   1  [id number]                  [id number]                  [id number]  ---------------------------------------------------------------------- Indications   Hyperemesis gravidarum with metabolic          O21.1   disturbance   [redacted] weeks gestation of pregnancy                Z3A.11  ---------------------------------------------------------------------- Fetal Evaluation  Num Of Fetuses:         1  Fetal Heart Rate(bpm):  180  Cardiac Activity:       Observed  Presentation:           Variable  Placenta:               Anterior  Amniotic Fluid  AFI FV:      Within normal limits ---------------------------------------------------------------------- Biometry  CRL:     51.27  mm     G. Age:  11w 5d                  EDD:   12/08/19 ---------------------------------------------------------------------- OB History  Gravidity:    1 ---------------------------------------------------------------------- Gestational Age  Clinical EDD:  11w 4d                                        EDD:   12/09/19  Best:          11w 4d     Det. By:  Clinical EDD             EDD:   12/09/19 ---------------------------------------------------------------------- Cervix Uterus Adnexa  Cervix  Normal appearance by transabdominal scan.  Uterus  No abnormality visualized.  Left Ovary  Within normal limits.  Right Ovary  Not visualized.  Adnexa  No abnormality visualized. ---------------------------------------------------------------------- Impression  Ms. Borner, G1 P0, is admitted with hyperemesis gravidarum.  A limited ultrasound study was performed. The CRL  measurement is consistent with her previously-established  dates and good fetal heart activity is seen. The nuchal-  translucency measures 3 millimeters (increased). However,  full evaluation could not be made because of fetal position. ---------------------------------------------------------------------- Recommendations  -First-trimester anatomy including nuchal-translucency   measurement next week.  -Offer cell-free fetal DNA screening. ----------------------------------------------------------------------                  Noralee Space, MD Electronically Signed Final Report  05/24/2019 10:22 am ----------------------------------------------------------------------  US Abdomen Limited Ruq  Result Date: 05/23/2019 CLINICAL DATA:  Eleven weeks pregnant nausea and vomiting EXAM: ULTRASOUND ABDOMEN LIMITED RIGHT UPPER QUADRANT COMPARISON:  None. FINDINGS: Gallbladder: Filled with sludge. No shadowing stone. Normal wall thickness. Negative sonographic Murphy Common bile duct: Diameter: 5.4 mm Liver: No focal lesion identified. Within normal limits in parenchymal echogenicity. Portal vein is patent on color Doppler imaging with normal direction of blood flow towards the liver. Other: None. IMPRESSION: 1. Gallbladder sludge without acute inflammatory change by sonography. No biliary dilatation 2. Ultrasound appearance of the liver is within normal limits Electronically Signed   By: Jasmine Pang M.D.   On: 05/23/2019 21:52   Current scheduled medications . folic acid  1 mg Intravenous Daily  . metoCLOPramide (REGLAN) injection  10 mg Intravenous Q6H  . thiamine injection  100 mg Intravenous Daily   I have reviewed the patient's current medications.  ASSESSMENT: Principal Problem:   Hyperemesis gravidarum Active Problems:   Hypokalemia   Elevated LFTs   Scleral icterus   Dehydration   Hyponatremia  PLAN: Continue electrolyte repletion Continue to advance diet as tolerated Not concerned about hemoglobin of 18.8 (had values of 16 at outside facility). Likely hemoconcentrated due to dehydration. Also curbsided Heme-Onc specialist (Dr. Clelia Croft), he is not concerned. May decrease to high normal values after hydration. Follow up ultrasound report Continue routine antenatal care.   Jaynie Collins, MD, FACOG Obstetrician & Gynecologist, [facility] for  [employer], [facility]

## 2019-05-24 NOTE — Progress Notes (Signed)
CRITICAL VALUE ALERT  Critical Value:  Potassium 2.3  Date & Time Notied:  05/24/2019 at [place]  Provider Notified: Dr Nehemiah Settle  Orders Received/Actions taken:   No new orders

## 2019-05-25 DIAGNOSIS — O283 Abnormal ultrasonic finding on antenatal screening of mother: Secondary | ICD-10-CM

## 2019-05-25 LAB — CBC
HCT: 35.7 % — ABNORMAL LOW (ref 36.0–46.0)
Hemoglobin: 12.8 g/dL (ref 12.0–15.0)
MCH: 30 pg (ref 26.0–34.0)
MCHC: 35.9 g/dL (ref 30.0–36.0)
MCV: 83.6 fL (ref 80.0–100.0)
Platelets: 164 10*3/uL (ref 150–400)
RBC: 4.27 MIL/uL (ref 3.87–5.11)
RDW: 12.7 % (ref 11.5–15.5)
WBC: 7.1 10*3/uL (ref 4.0–10.5)
nRBC: 0 % (ref 0.0–0.2)

## 2019-05-25 LAB — COMPREHENSIVE METABOLIC PANEL
ALT: 83 U/L — ABNORMAL HIGH (ref 0–44)
AST: 59 U/L — ABNORMAL HIGH (ref 15–41)
Albumin: 2.7 g/dL — ABNORMAL LOW (ref 3.5–5.0)
Alkaline Phosphatase: 47 U/L (ref 38–126)
Anion gap: 7 (ref 5–15)
BUN: <5 mg/dL — ABNORMAL LOW (ref 6–20)
CO2: 28 mmol/L (ref 22–32)
Calcium: 8.2 mg/dL — ABNORMAL LOW (ref 8.9–10.3)
Chloride: 101 mmol/L (ref 98–111)
Creatinine, Ser: 0.42 mg/dL — ABNORMAL LOW (ref 0.44–1.00)
GFR calc Af Amer: >60 mL/min (ref >60)
GFR calc non Af Amer: >60 mL/min (ref >60)
Glucose, Bld: 101 mg/dL — ABNORMAL HIGH (ref 70–99)
Potassium: 2.9 mmol/L — ABNORMAL LOW (ref 3.5–5.1)
Sodium: 136 mmol/L (ref 135–145)
Total Bilirubin: 4 mg/dL — ABNORMAL HIGH (ref 0.3–1.2)
Total Protein: 4.7 g/dL — ABNORMAL LOW (ref 6.5–8.1)

## 2019-05-25 LAB — MAGNESIUM: Magnesium: 2 mg/dL (ref 1.7–2.4)

## 2019-05-25 MED ORDER — POTASSIUM CHLORIDE CRYS ER 20 MEQ PO TBCR
20.0000 meq | EXTENDED_RELEASE_TABLET | Freq: Three times a day (TID) | ORAL | Status: DC
  Filled 2019-05-25: qty 1

## 2019-05-25 MED ORDER — POTASSIUM CHLORIDE 20 MEQ/15ML (10%) PO SOLN: 20.0000 meq | Freq: Two times a day (BID) | ORAL | Status: DC

## 2019-05-25 MED ORDER — POTASSIUM CHLORIDE 20 MEQ/15ML (10%) PO SOLN
20.0000 meq | Freq: Two times a day (BID) | ORAL | Status: DC
  Administered 2019-05-25: 20 meq via ORAL
  Filled 2019-05-25 (×2): qty 15

## 2019-05-25 NOTE — Progress Notes (Signed)
[place] COMPREHENSIVE PROGRESS NOTE  Jordan Murray is a 18 y.o. G1P0 at [redacted]w[redacted]d who is admitted for HEG.  Estimated Date of Delivery: 12/09/19  Length of Stay:  2 Days. Admitted 05/23/2019  Subjective: Symptoms have improved. Tolerating clear liquids.  Wants to eat regular food. She reports no abdominal pain, no bleeding and no loss of fluid per vagina.  Vitals:  Blood pressure (!) 96/54, pulse 83, temperature 98 F (36.7 C), temperature source Oral, resp. rate 16, height 5\' 4"  (1.626 m), weight 54 kg, SpO2 98 %. Physical Examination: CONSTITUTIONAL: Well-developed, well-nourished female in no acute distress.  HENT:  Normocephalic, atraumatic, External right and left ear normal. Oropharynx is clear and moist EYES: Conjunctivae and EOM are normal. Pupils are equal, round, and reactive to light. No scleral icterus.  NECK: Normal range of motion, supple, no masses SKIN: Skin is warm and dry. No rash noted. Not diaphoretic. No erythema. No pallor. [place]: Alert and oriented to person, place, and time. Normal reflexes, muscle tone coordination. No cranial nerve deficit noted. PSYCHIATRIC: Normal mood and affect. Normal behavior. Normal judgment and thought content. CARDIOVASCULAR: Normal heart rate noted, regular rhythm RESPIRATORY: Effort and breath sounds normal, no problems with respiration noted MUSCULOSKELETAL: Normal range of motion. No edema and no tenderness. 2+ distal pulses. ABDOMEN: Soft, nontender, nondistended, gravid. CERVIX:  Deferred  Fetal monitoring: FHR: 180 bpm on MFM scan on 05/24/19  Results for orders placed or performed during the hospital encounter of 05/23/19 (from the past 48 hour(s))  CBC     Status: Abnormal   Collection Time: 05/23/19  6:36 PM  Result Value Ref Range   WBC 15.0 (H) 4.0 - 10.5 K/uL   RBC 6.32 (H) 3.87 - 5.11 MIL/uL   Hemoglobin 18.8 (H) 12.0 - 15.0 g/dL   HCT 50.2 (H) 36.0 - 46.0 %   MCV 79.4 (L) 80.0 - 100.0 fL   MCH  29.7 26.0 - 34.0 pg   MCHC 37.5 (H) 30.0 - 36.0 g/dL    Comment: CORRECTED FOR ICTERUS   RDW 12.2 11.5 - 15.5 %   Platelets 256 150 - 400 K/uL   nRBC 0.1 0.0 - 0.2 %    Comment: Performed at [facility], [place] [address], [place], [place] [address]  Comprehensive metabolic panel     Status: Abnormal   Collection Time: 05/23/19  6:36 PM  Result Value Ref Range   Sodium 126 (L) 135 - 145 mmol/L   Potassium 2.4 (LL) 3.5 - 5.1 mmol/L    Comment: CRITICAL RESULT CALLED TO, READ BACK BY AND VERIFIED WITH: A.CIOCE RN 1931 05/23/2019 MCCORMICK K    Chloride 80 (L) 98 - 111 mmol/L   CO2 24 22 - 32 mmol/L   Glucose, Bld 101 (H) 70 - 99 mg/dL   BUN 8 6 - 20 mg/dL   Creatinine, Ser 0.67 0.44 - 1.00 mg/dL   Calcium 9.5 8.9 - 10.3 mg/dL   Total Protein 7.0 6.5 - 8.1 g/dL   Albumin 4.1 3.5 - 5.0 g/dL   AST 58 (H) 15 - 41 U/L   ALT 83 (H) 0 - 44 U/L   Alkaline Phosphatase 65 38 - 126 U/L   Total Bilirubin 9.1 (H) 0.3 - 1.2 mg/dL   GFR calc non Af Amer >60 >60 mL/min   GFR calc Af Amer >60 >60 mL/min   Anion gap 22 (H) 5 - 15    Comment: Performed at [facility], [address] [place], [place] [address]  Urinalysis, Complete w Microscopic     Status: Abnormal   Collection Time: 05/23/19  7:45 PM  Result Value Ref Range   Color, Urine AMBER (A) YELLOW    Comment: BIOCHEMICALS MAY BE AFFECTED BY COLOR   APPearance HAZY (A) CLEAR   Specific Gravity, Urine 1.025 1.005 - 1.030   pH 6.0 5.0 - 8.0   Glucose, UA NEGATIVE NEGATIVE mg/dL   Hgb urine dipstick NEGATIVE NEGATIVE   Bilirubin Urine SMALL (A) NEGATIVE   Ketones, ur 80 (A) NEGATIVE mg/dL   Protein, ur 811 (A) NEGATIVE mg/dL   Nitrite NEGATIVE NEGATIVE   Leukocytes,Ua NEGATIVE NEGATIVE   RBC / HPF 0-5 0 - 5 RBC/hpf   WBC, UA 0-5 0 - 5 WBC/hpf   Bacteria, UA RARE (A) NONE SEEN   Squamous Epithelial / LPF 0-5 0 - 5   Mucus PRESENT    Hyaline Casts, UA PRESENT     Comment: Performed at [facility], [address], [place], [place] [address]  Bilirubin, fractionated(tot/dir/indir)     Status: Abnormal   Collection Time: 05/23/19  7:45 PM  Result Value Ref Range   Total Bilirubin 9.2 (H) 0.3 - 1.2 mg/dL   Bilirubin, Direct 2.9 (H) 0.0 - 0.2 mg/dL   Indirect Bilirubin 6.3 (H) 0.3 - 0.9 mg/dL    Comment: Performed at [facility], [address] [place], [place] [address]  Hepatitis panel, acute     Status: None   Collection Time: 05/23/19  7:45 PM  Result Value Ref Range   Hepatitis B Surface Ag NON REACTIVE NON REACTIVE   HCV Ab NON REACTIVE NON REACTIVE    Comment: (NOTE) Nonreactive HCV antibody screen is consistent with no HCV infections,  unless recent infection is suspected or other evidence exists to indicate HCV infection.    Hep A IgM NON REACTIVE NON REACTIVE   Hep B C IgM NON REACTIVE NON REACTIVE    Comment: Performed at [facility], [address] [place], [place] [address]  SARS Coronavirus 2 by RT PCR (hospital order, performed in [facility] hospital lab) Nasopharyngeal Nasopharyngeal Swab     Status: None   Collection Time: 05/23/19  8:10 PM   Specimen: Nasopharyngeal Swab  Result Value Ref Range   SARS Coronavirus 2 NEGATIVE NEGATIVE    Comment: (NOTE) If result is NEGATIVE SARS-CoV-2 target nucleic acids are NOT DETECTED. The SARS-CoV-2 RNA is generally detectable in upper and lower  respiratory specimens during the acute phase of infection. The lowest  concentration of SARS-CoV-2 viral copies this assay can detect is 250  copies / mL. A negative result does not preclude SARS-CoV-2 infection  and should not be used as the sole basis for treatment or other  patient management decisions.  A negative result may occur with  improper specimen collection / handling, submission of specimen other  than nasopharyngeal swab, presence of viral mutation(s) within the  areas targeted by this assay, and inadequate number of viral copies  (<250 copies / mL). A negative  result must be combined with clinical  observations, patient history, and epidemiological information. If result is POSITIVE SARS-CoV-2 target nucleic acids are DETECTED. The SARS-CoV-2 RNA is generally detectable in upper and lower  respiratory specimens dur ing the acute phase of infection.  Positive  results are indicative of active infection with SARS-CoV-2.  Clinical  correlation with patient history and other diagnostic information is  necessary to determine patient infection status.  Positive results do  not rule out bacterial infection or co-infection with other viruses. If result is PRESUMPTIVE POSTIVE SARS-CoV-2 nucleic acids MAY BE PRESENT.   A presumptive positive result was obtained on the submitted specimen  and confirmed on repeat testing.  While 2019 novel coronavirus  (SARS-CoV-2) nucleic acids may be present in the submitted sample  additional confirmatory testing may be necessary for epidemiological  and / or clinical management purposes  to differentiate between  SARS-CoV-2 and other Sarbecovirus currently known to infect humans.  If clinically indicated additional testing with an alternate test  methodology [phone]) is advised. The SARS-CoV-2 RNA is generally  detectable in upper and lower respiratory sp ecimens during the acute  phase of infection. The expected result is Negative. Fact Sheet for Patients:  BoilerBrush.com.cy Fact Sheet for Healthcare Providers: https://pope.com/ This test is not yet approved or cleared by the [place] FDA and has been authorized for detection and/or diagnosis of SARS-CoV-2 by FDA under an Emergency Use Authorization (EUA).  This EUA will remain in effect (meaning this test can be used) for the duration of the COVID-19 declaration under Section 564(b)(1) of the Act, 21 U.S.C. section 360bbb-3(b)(1), unless the authorization is terminated or revoked sooner. Performed at [facility], [address], [place], [place] [address]   Blood gas, venous     Status: Abnormal   Collection Time: 05/23/19  8:50 PM  Result Value Ref Range   FIO2 0.21    Delivery systems ROOM AIR    pH, Ven 7.490 (H) 7.250 - 7.430   pCO2, Ven 38.1 (L) 44.0 - 60.0 mmHg   pO2, Ven 34.6 32.0 - 45.0 mmHg   Bicarbonate 28.7 (H) 20.0 - 28.0 mmol/L   Acid-Base Excess 5.5 (H) 0.0 - 2.0 mmol/L   Collection site RIGHT BRACHIAL    Drawn by [id number]    Sample type VENOUS   Basic metabolic panel     Status: Abnormal   Collection Time: 05/24/19 12:00 AM  Result Value Ref Range   Sodium 129 (L) 135 - 145 mmol/L   Potassium 2.3 (LL) 3.5 - 5.1 mmol/L    Comment: CRITICAL RESULT CALLED TO, READ BACK BY AND VERIFIED WITH: RN C STOTES  05/24/19 BY S GEZAHEGN    Chloride 87 (L) 98 - 111 mmol/L   CO2 26 22 - 32 mmol/L   Glucose, Bld 114 (H) 70 - 99 mg/dL   BUN 5 (L) 6 - 20 mg/dL   Creatinine, Ser 0.86 0.44 - 1.00 mg/dL   Calcium 8.5 (L) 8.9 - 10.3 mg/dL   GFR calc non Af Amer >60 >60 mL/min   GFR calc Af Amer >60 >60 mL/min   Anion gap 16 (H) 5 - 15    Comment: Performed at [facility], [address] [place], [place] [address]  Comprehensive metabolic panel     Status: Abnormal   Collection Time: 05/24/19  4:56 AM  Result Value Ref Range   Sodium 129 (L) 135 - 145 mmol/L   Potassium 2.6 (LL) 3.5 - 5.1 mmol/L    Comment: CRITICAL RESULT CALLED TO, READ BACK BY AND VERIFIED WITH: K.STOKES,RN 05/24/2019 0619 DAVISB    Chloride 91 (L) 98 - 111 mmol/L   CO2 27 22 - 32 mmol/L   Glucose, Bld 105 (H) 70 - 99 mg/dL   BUN 5 (L) 6 - 20 mg/dL   Creatinine, Ser 9.62 0.44 - 1.00  mg/dL   Calcium 8.7 (L) 8.9 - 10.3 mg/dL   Total Protein 5.2 (L) 6.5 - 8.1 g/dL   Albumin 3.0 (L) 3.5 - 5.0 g/dL   AST 44 (H) 15 - 41 U/L   ALT 64 (H) 0 - 44 U/L   Alkaline Phosphatase 53 38 - 126 U/L   Total Bilirubin 7.0 (H) 0.3 - 1.2 mg/dL   GFR calc non Af Amer >60 >60 mL/min   GFR calc Af Amer >60 >60  mL/min   Anion gap 11 5 - 15    Comment: Performed at [facility], [address], [place], [place] [address]  Bilirubin, direct     Status: Abnormal   Collection Time: 05/24/19  4:56 AM  Result Value Ref Range   Bilirubin, Direct 2.2 (H) 0.0 - 0.2 mg/dL    Comment: Performed at [facility], [address] [place], [place] [address]  Magnesium     Status: Abnormal   Collection Time: 05/24/19  4:56 AM  Result Value Ref Range   Magnesium 1.6 (L) 1.7 - 2.4 mg/dL    Comment: Performed at [facility], [address], [place], [place] [address]  Comprehensive metabolic panel     Status: Abnormal   Collection Time: 05/25/19  5:34 AM  Result Value Ref Range   Sodium 136 135 - 145 mmol/L   Potassium 2.9 (L) 3.5 - 5.1 mmol/L   Chloride 101 98 - 111 mmol/L   CO2 28 22 - 32 mmol/L   Glucose, Bld 101 (H) 70 - 99 mg/dL   BUN <5 (L) 6 - 20 mg/dL   Creatinine, Ser 9.14 (L) 0.44 - 1.00 mg/dL   Calcium 8.2 (L) 8.9 - 10.3 mg/dL   Total Protein 4.7 (L) 6.5 - 8.1 g/dL   Albumin 2.7 (L) 3.5 - 5.0 g/dL   AST 59 (H) 15 - 41 U/L   ALT 83 (H) 0 - 44 U/L   Alkaline Phosphatase 47 38 - 126 U/L   Total Bilirubin 4.0 (H) 0.3 - 1.2 mg/dL   GFR calc non Af Amer >60 >60 mL/min   GFR calc Af Amer >60 >60 mL/min   Anion gap 7 5 - 15    Comment: Performed at [facility], [address], [place], [place] [address]  Magnesium     Status: None   Collection Time: 05/25/19  5:34 AM  Result Value Ref Range   Magnesium 2.0 1.7 - 2.4 mg/dL    Comment: Performed at [facility], [address] [place], [place] [address]  CBC     Status: Abnormal   Collection Time: 05/25/19  5:34 AM  Result Value Ref Range   WBC 7.1 4.0 - 10.5 K/uL   RBC 4.27 3.87 - 5.11 MIL/uL   Hemoglobin 12.8 12.0 - 15.0 g/dL    Comment: REPEATED TO VERIFY   HCT 35.7 (L) 36.0 - 46.0 %   MCV 83.6 80.0 - 100.0 fL   MCH 30.0 26.0 - 34.0 pg   MCHC 35.9 30.0 - 36.0 g/dL   RDW 86.5 78.4 - 69.6 %   Platelets 164  150 - 400 K/uL   nRBC 0.0 0.0 - 0.2 %    Comment: Performed at [facility], [address] [place], [place] [address]    [place] Mfm Ob Limited  Result Date: 05/24/2019 ----------------------------------------------------------------------  OBSTETRICS REPORT                       (  Signed Final 05/24/2019 10:22 am) ---------------------------------------------------------------------- Patient Info  ID #:       [id number]                          D.O.B.:  2001/07/30 (18 yrs)  Name:       Jordan Murray                   Visit Date: 05/24/2019 09:12 am ---------------------------------------------------------------------- Performed By  Performed By:     Hurman Horn          Ref. Address:     [address]                                                             [place], [place]                                                             [address]  Attending:        Noralee Space MD        [facility]  Referred By:      Tereso Newcomer MD ---------------------------------------------------------------------- Orders   #  Description                          Code         Ordered By   1  [place] MFM OB LIMITED                    59163.84     Jaynie Collins  ----------------------------------------------------------------------   #  Order #                    Accession #                 Episode #   1  [id number]                  [id number]  [id number]  ---------------------------------------------------------------------- Indications   Hyperemesis gravidarum with metabolic          O21.1   disturbance   [redacted] weeks gestation of pregnancy                Z3A.11  ---------------------------------------------------------------------- Fetal Evaluation  Num Of Fetuses:         1  Fetal Heart Rate(bpm):  180   Cardiac Activity:       Observed  Presentation:           Variable  Placenta:               Anterior  Amniotic Fluid  AFI FV:      Within normal limits ---------------------------------------------------------------------- Biometry  CRL:     51.27  mm     G. Age:  11w 5d                  EDD:   12/08/19 ---------------------------------------------------------------------- OB History  Gravidity:    1 ---------------------------------------------------------------------- Gestational Age  Clinical EDD:  11w 4d                                        EDD:   12/09/19  Best:          11w 4d     Det. By:  Clinical EDD             EDD:   12/09/19 ---------------------------------------------------------------------- Cervix Uterus Adnexa  Cervix  Normal appearance by transabdominal scan.  Uterus  No abnormality visualized.  Left Ovary  Within normal limits.  Right Ovary  Not visualized.  Adnexa  No abnormality visualized. ---------------------------------------------------------------------- Impression  Ms. Landry, G1 P0, is admitted with hyperemesis gravidarum.  A limited ultrasound study was performed. The CRL  measurement is consistent with her previously-established  dates and good fetal heart activity is seen. The nuchal-  translucency measures 3 millimeters (increased). However,  full evaluation could not be made because of fetal position. ---------------------------------------------------------------------- Recommendations  -First-trimester anatomy including nuchal-translucency  measurement next week.  -Offer cell-free fetal DNA screening. ----------------------------------------------------------------------                  Noralee Spaceavi Shankar, MD Electronically Signed Final Report   05/24/2019 10:22 am ----------------------------------------------------------------------  [place] Abdomen Limited Ruq  Result Date: 05/23/2019 CLINICAL DATA:  Eleven weeks pregnant nausea and vomiting EXAM: ULTRASOUND ABDOMEN LIMITED RIGHT  UPPER QUADRANT COMPARISON:  None. FINDINGS: Gallbladder: Filled with sludge. No shadowing stone. Normal wall thickness. Negative sonographic Murphy Common bile duct: Diameter: 5.4 mm Liver: No focal lesion identified. Within normal limits in parenchymal echogenicity. Portal vein is patent on color Doppler imaging with normal direction of blood flow towards the liver. Other: None. IMPRESSION: 1. Gallbladder sludge without acute inflammatory change by sonography. No biliary dilatation 2. Ultrasound appearance of the liver is within normal limits Electronically Signed   By: Jasmine PangKim  Fujinaga M.D.   On: 05/23/2019 21:52   Current scheduled medications . folic acid  1 mg Intravenous Daily  . metoCLOPramide (REGLAN) injection  10 mg Intravenous Q6H  . potassium chloride  20 mEq Oral TID  . thiamine injection  100 mg Intravenous Daily   I have reviewed the patient's current medications.  ASSESSMENT: Principal Problem:   Hyperemesis gravidarum Active Problems:   Hypokalemia   Elevated LFTs   Scleral icterus   Dehydration   Hyponatremia  Increased nuchal translucency space on fetal ultrasound  PLAN: Continue electrolyte repletion, specifically K. Will be done orally. LFTs stable, bilirubin decreased.  Will recheck tomorrow. Continue to advance diet as tolerated Discussed findings of thickened nuchal translucency and possible implications, all questions answered. Scheduled follow up MFM NT scan as recommended next week.   Continue routine antenatal care, possible discharge tomorrow morning if tolerating regular diet.    Jaynie Collins, MD, FACOG Obstetrician & Gynecologist, [facility] for [employer], [facility]

## 2019-05-26 LAB — COMPREHENSIVE METABOLIC PANEL
ALT: 73 U/L — ABNORMAL HIGH (ref 0–44)
AST: 30 U/L (ref 15–41)
Albumin: 2.4 g/dL — ABNORMAL LOW (ref 3.5–5.0)
Alkaline Phosphatase: 39 U/L (ref 38–126)
Anion gap: 4 — ABNORMAL LOW (ref 5–15)
BUN: <5 mg/dL — ABNORMAL LOW (ref 6–20)
CO2: 23 mmol/L (ref 22–32)
Calcium: 8.2 mg/dL — ABNORMAL LOW (ref 8.9–10.3)
Chloride: 112 mmol/L — ABNORMAL HIGH (ref 98–111)
Creatinine, Ser: 0.58 mg/dL (ref 0.44–1.00)
GFR calc Af Amer: >60 mL/min (ref >60)
GFR calc non Af Amer: >60 mL/min (ref >60)
Glucose, Bld: 91 mg/dL (ref 70–99)
Potassium: 3.7 mmol/L (ref 3.5–5.1)
Sodium: 139 mmol/L (ref 135–145)
Total Bilirubin: 1.1 mg/dL (ref 0.3–1.2)
Total Protein: 4.3 g/dL — ABNORMAL LOW (ref 6.5–8.1)

## 2019-05-26 LAB — MAGNESIUM: Magnesium: 1.6 mg/dL — ABNORMAL LOW (ref 1.7–2.4)

## 2019-05-26 MED ORDER — METOCLOPRAMIDE HCL 10 MG PO TABS: 10.0000 mg | ORAL_TABLET | Freq: Four times a day (QID) | ORAL | 3 refills | Status: DC | PRN

## 2019-05-26 MED ORDER — PROMETHAZINE HCL 25 MG PO TABS: 25.0000 mg | ORAL_TABLET | Freq: Four times a day (QID) | ORAL | 2 refills | Status: AC | PRN

## 2019-05-26 MED ORDER — ONDANSETRON HCL 4 MG PO TABS: 4.0000 mg | ORAL_TABLET | Freq: Four times a day (QID) | ORAL | 2 refills | Status: DC | PRN

## 2019-05-26 MED ORDER — SCOPOLAMINE 1 MG/3DAYS TD PT72: 1.0000 | MEDICATED_PATCH | TRANSDERMAL | 1 refills | Status: DC

## 2019-05-26 NOTE — Progress Notes (Signed)
Discharge teaching complete with pt. Pt understood all instructions and did not have any questions. Pt discharged home with family. 

## 2019-05-26 NOTE — Discharge Summary (Signed)
Antenatal Physician Discharge Summary  Patient ID: Jordan Murray MRN: [id number] DOB/AGE: [birth year] 18 y.o.  Admit date: 05/23/2019 Discharge date: 05/26/2019  Admission Diagnoses: Principal Problem:   Hyperemesis gravidarum (HEG) Active Problems:   Hypokalemia   Elevated LFTs   Scleral icterus   Dehydration   Hyponatremia   Increased nuchal translucency space on fetal ultrasound  Discharge Diagnoses: The same  Prenatal Procedures: ultrasound  Consults: Maternal Fetal Medicine  Hospital Course:  This is a 18 y.o. G1P0 with IUP at [redacted]w[redacted]d admitted for HEG.  Had multiple medications to help with this, also had aggressive electrolyte repletion.  Labs stabilized. She tolerated regular diet by time of discharge. Of note, nuchal translucency thickening was incidentally noted on ultrasound, MFM follow up scheduled.   She was deemed stable for discharge to home with outpatient follow up.  Discharge Exam: Temp:  [97.7 F (36.5 C)-98.4 F (36.9 C)] 97.7 F (36.5 C) (10/23 0810) Pulse Rate:  [84-96] 96 (10/23 0810) Resp:  [16-17] 17 (10/23 0810) BP: (100-135)/(54-75) 135/75 (10/23 0810) SpO2:  [100 %] 100 % (10/23 0810) Physical Examination: CONSTITUTIONAL: Well-developed, well-nourished female in no acute distress.  HENT:  Normocephalic, atraumatic, External right and left ear normal. Oropharynx is clear and moist EYES: Conjunctivae and EOM are normal. Pupils are equal, round, and reactive to light. No scleral icterus.  NECK: Normal range of motion, supple, no masses SKIN: Skin is warm and dry. No rash noted. Not diaphoretic. No erythema. No pallor. NEUROLGIC: Alert and oriented to person, place, and time. Normal reflexes, muscle tone coordination. No cranial nerve deficit noted. PSYCHIATRIC: Normal mood and affect. Normal behavior. Normal judgment and thought content. CARDIOVASCULAR: Normal heart rate noted, regular rhythm RESPIRATORY: Effort and breath sounds normal, no problems  with respiration noted MUSCULOSKELETAL: Normal range of motion. No edema and no tenderness. 2+ distal pulses. ABDOMEN: Soft, nontender, nondistended CERVIX:  Deferred  Fetal monitoring: FHR: 180 bpm  Significant Diagnostic Studies:  Results for orders placed or performed during the hospital encounter of 05/23/19 (from the past 168 hour(s))  CBC   Collection Time: 05/23/19  6:36 PM  Result Value Ref Range   WBC 15.0 (H) 4.0 - 10.5 K/uL   RBC 6.32 (H) 3.87 - 5.11 MIL/uL   Hemoglobin 18.8 (H) 12.0 - 15.0 g/dL   HCT 40.9 (H) 81.1 - 91.4 %   MCV 79.4 (L) 80.0 - 100.0 fL   MCH 29.7 26.0 - 34.0 pg   MCHC 37.5 (H) 30.0 - 36.0 g/dL   RDW 78.2 95.6 - 21.3 %   Platelets 256 150 - 400 K/uL   nRBC 0.1 0.0 - 0.2 %  Comprehensive metabolic panel   Collection Time: 05/23/19  6:36 PM  Result Value Ref Range   Sodium 126 (L) 135 - 145 mmol/L   Potassium 2.4 (LL) 3.5 - 5.1 mmol/L   Chloride 80 (L) 98 - 111 mmol/L   CO2 24 22 - 32 mmol/L   Glucose, Bld 101 (H) 70 - 99 mg/dL   BUN 8 6 - 20 mg/dL   Creatinine, Ser 0.86 0.44 - 1.00 mg/dL   Calcium 9.5 8.9 - 57.8 mg/dL   Total Protein 7.0 6.5 - 8.1 g/dL   Albumin 4.1 3.5 - 5.0 g/dL   AST 58 (H) 15 - 41 U/L   ALT 83 (H) 0 - 44 U/L   Alkaline Phosphatase 65 38 - 126 U/L   Total Bilirubin 9.1 (H) 0.3 - 1.2 mg/dL   GFR calc non Af  Amer >60 >60 mL/min   GFR calc Af Amer >60 >60 mL/min   Anion gap 22 (H) 5 - 15  Urinalysis, Complete w Microscopic   Collection Time: 05/23/19  7:45 PM  Result Value Ref Range   Color, Urine AMBER (A) YELLOW   APPearance HAZY (A) CLEAR   Specific Gravity, Urine 1.025 1.005 - 1.030   pH 6.0 5.0 - 8.0   Glucose, UA NEGATIVE NEGATIVE mg/dL   Hgb urine dipstick NEGATIVE NEGATIVE   Bilirubin Urine SMALL (A) NEGATIVE   Ketones, ur 80 (A) NEGATIVE mg/dL   Protein, ur 161100 (A) NEGATIVE mg/dL   Nitrite NEGATIVE NEGATIVE   Leukocytes,Ua NEGATIVE NEGATIVE   RBC / HPF 0-5 0 - 5 RBC/hpf   WBC, UA 0-5 0 - 5 WBC/hpf    Bacteria, UA RARE (A) NONE SEEN   Squamous Epithelial / LPF 0-5 0 - 5   Mucus PRESENT    Hyaline Casts, UA PRESENT   Bilirubin, fractionated(tot/dir/indir)   Collection Time: 05/23/19  7:45 PM  Result Value Ref Range   Total Bilirubin 9.2 (H) 0.3 - 1.2 mg/dL   Bilirubin, Direct 2.9 (H) 0.0 - 0.2 mg/dL   Indirect Bilirubin 6.3 (H) 0.3 - 0.9 mg/dL  Hepatitis panel, acute   Collection Time: 05/23/19  7:45 PM  Result Value Ref Range   Hepatitis B Surface Ag NON REACTIVE NON REACTIVE   HCV Ab NON REACTIVE NON REACTIVE   Hep A IgM NON REACTIVE NON REACTIVE   Hep B C IgM NON REACTIVE NON REACTIVE  SARS Coronavirus 2 by RT PCR (hospital order[facility]) Nasopharyngeal Nasopharyngeal Swab   Collection Time: 05/23/19  8:10 PM   Specimen: Nasopharyngeal Swab  Result Value Ref Range   SARS Coronavirus 2 NEGATIVE NEGATIVE  Blood gas, venous   Collection Time: 05/23/19  8:50 PM  Result Value Ref Range   FIO2 0.21    Delivery systems ROOM AIR    pH, Ven 7.490 (H) 7.250 - 7.430   pCO2, Ven 38.1 (L) 44.0 - 60.0 mmHg   pO2, Ven 34.6 32.0 - 45.0 mmHg   Bicarbonate 28.7 (H) 20.0 - 28.0 mmol/L   Acid-Base Excess 5.5 (H) 0.0 - 2.0 mmol/L   Collection site RIGHT BRACHIAL    Drawn by [id number]    Sample type VENOUS   Basic metabolic panel   Collection Time: 05/24/19 12:00 AM  Result Value Ref Range   Sodium 129 (L) 135 - 145 mmol/L   Potassium 2.3 (LL) 3.5 - 5.1 mmol/L   Chloride 87 (L) 98 - 111 mmol/L   CO2 26 22 - 32 mmol/L   Glucose, Bld 114 (H) 70 - 99 mg/dL   BUN 5 (L) 6 - 20 mg/dL   Creatinine, Ser 4.090.51 0.44 - 1.00 mg/dL   Calcium 8.5 (L) 8.9 - 10.3 mg/dL   GFR calc non Af Amer >60 >60 mL/min   GFR calc Af Amer >60 >60 mL/min   Anion gap 16 (H) 5 - 15  Comprehensive metabolic panel   Collection Time: 05/24/19  4:56 AM  Result Value Ref Range   Sodium 129 (L) 135 - 145 mmol/L   Potassium 2.6 (LL) 3.5 - 5.1 mmol/L   Chloride 91 (L) 98 - 111 mmol/L   CO2 27  22 - 32 mmol/L   Glucose, Bld 105 (H) 70 - 99 mg/dL   BUN 5 (L) 6 - 20 mg/dL   Creatinine, Ser 8.110.55 0.44 - 1.00  mg/dL   Calcium 8.7 (L) 8.9 - 10.3 mg/dL   Total Protein 5.2 (L) 6.5 - 8.1 g/dL   Albumin 3.0 (L) 3.5 - 5.0 g/dL   AST 44 (H) 15 - 41 U/L   ALT 64 (H) 0 - 44 U/L   Alkaline Phosphatase 53 38 - 126 U/L   Total Bilirubin 7.0 (H) 0.3 - 1.2 mg/dL   GFR calc non Af Amer >60 >60 mL/min   GFR calc Af Amer >60 >60 mL/min   Anion gap 11 5 - 15  Bilirubin, direct   Collection Time: 05/24/19  4:56 AM  Result Value Ref Range   Bilirubin, Direct 2.2 (H) 0.0 - 0.2 mg/dL  Magnesium   Collection Time: 05/24/19  4:56 AM  Result Value Ref Range   Magnesium 1.6 (L) 1.7 - 2.4 mg/dL  Comprehensive metabolic panel   Collection Time: 05/25/19  5:34 AM  Result Value Ref Range   Sodium 136 135 - 145 mmol/L   Potassium 2.9 (L) 3.5 - 5.1 mmol/L   Chloride 101 98 - 111 mmol/L   CO2 28 22 - 32 mmol/L   Glucose, Bld 101 (H) 70 - 99 mg/dL   BUN <5 (L) 6 - 20 mg/dL   Creatinine, Ser 3.00 (L) 0.44 - 1.00 mg/dL   Calcium 8.2 (L) 8.9 - 10.3 mg/dL   Total Protein 4.7 (L) 6.5 - 8.1 g/dL   Albumin 2.7 (L) 3.5 - 5.0 g/dL   AST 59 (H) 15 - 41 U/L   ALT 83 (H) 0 - 44 U/L   Alkaline Phosphatase 47 38 - 126 U/L   Total Bilirubin 4.0 (H) 0.3 - 1.2 mg/dL   GFR calc non Af Amer >60 >60 mL/min   GFR calc Af Amer >60 >60 mL/min   Anion gap 7 5 - 15  Magnesium   Collection Time: 05/25/19  5:34 AM  Result Value Ref Range   Magnesium 2.0 1.7 - 2.4 mg/dL  CBC   Collection Time: 05/25/19  5:34 AM  Result Value Ref Range   WBC 7.1 4.0 - 10.5 K/uL   RBC 4.27 3.87 - 5.11 MIL/uL   Hemoglobin 12.8 12.0 - 15.0 g/dL   HCT 76.2 (L) 26.3 - 33.5 %   MCV 83.6 80.0 - 100.0 fL   MCH 30.0 26.0 - 34.0 pg   MCHC 35.9 30.0 - 36.0 g/dL   RDW 45.6 25.6 - 38.9 %   Platelets 164 150 - 400 K/uL   nRBC 0.0 0.0 - 0.2 %  Comprehensive metabolic panel   Collection Time: 05/26/19  6:43 AM  Result Value Ref Range   Sodium  139 135 - 145 mmol/L   Potassium 3.7 3.5 - 5.1 mmol/L   Chloride 112 (H) 98 - 111 mmol/L   CO2 23 22 - 32 mmol/L   Glucose, Bld 91 70 - 99 mg/dL   BUN <5 (L) 6 - 20 mg/dL   Creatinine, Ser 3.73 0.44 - 1.00 mg/dL   Calcium 8.2 (L) 8.9 - 10.3 mg/dL   Total Protein 4.3 (L) 6.5 - 8.1 g/dL   Albumin 2.4 (L) 3.5 - 5.0 g/dL   AST 30 15 - 41 U/L   ALT 73 (H) 0 - 44 U/L   Alkaline Phosphatase 39 38 - 126 U/L   Total Bilirubin 1.1 0.3 - 1.2 mg/dL   GFR calc non Af Amer >60 >60 mL/min   GFR calc Af Amer >60 >60 mL/min   Anion gap 4 (L) 5 -  15  Magnesium   Collection Time: 05/26/19  6:43 AM  Result Value Ref Range   Magnesium 1.6 (L) 1.7 - 2.4 mg/dL   [place] Mfm Ob Limited  Result Date: 05/24/2019 ----------------------------------------------------------------------  OBSTETRICS REPORT                       (Signed Final 05/24/2019 10:22 am) ---------------------------------------------------------------------- Patient Info  ID #:       [id number]                          D.O.B.:  2000/10/24 (18 yrs)  Name:       Phineas Inches                   Visit Date: 05/24/2019 09:12 am ---------------------------------------------------------------------- Performed By  Performed By:     Hurman Horn          Ref. Address:     [address]                                                             [place], [place]                                                             [address]  Attending:        Noralee Space MD        [facility]  Referred By:      Tereso Newcomer MD ---------------------------------------------------------------------- Orders   #  Description                          Code         Ordered By   1  [place] MFM OB LIMITED                    19147.82     Jaynie Collins   ----------------------------------------------------------------------   #  Order #  Accession #                 Episode #   1  [id number]                  [id number]                  [id number]  ---------------------------------------------------------------------- Indications   Hyperemesis gravidarum with metabolic          O21.1   disturbance   [redacted] weeks gestation of pregnancy                Z3A.11  ---------------------------------------------------------------------- Fetal Evaluation  Num Of Fetuses:         1  Fetal Heart Rate(bpm):  180  Cardiac Activity:       Observed  Presentation:           Variable  Placenta:               Anterior  Amniotic Fluid  AFI FV:      Within normal limits ---------------------------------------------------------------------- Biometry  CRL:     51.27  mm     G. Age:  11w 5d                  EDD:   12/08/19 ---------------------------------------------------------------------- OB History  Gravidity:    1 ---------------------------------------------------------------------- Gestational Age  Clinical EDD:  11w 4d                                        EDD:   12/09/19  Best:          11w 4d     Det. By:  Clinical EDD             EDD:   12/09/19 ---------------------------------------------------------------------- Cervix Uterus Adnexa  Cervix  Normal appearance by transabdominal scan.  Uterus  No abnormality visualized.  Left Ovary  Within normal limits.  Right Ovary  Not visualized.  Adnexa  No abnormality visualized. ---------------------------------------------------------------------- Impression  Ms. Holz, G1 P0, is admitted with hyperemesis gravidarum.  A limited ultrasound study was performed. The CRL  measurement is consistent with her previously-established  dates and good fetal heart activity is seen. The nuchal-  translucency measures 3 millimeters (increased). However,  full evaluation could not be made because of fetal position.  ---------------------------------------------------------------------- Recommendations  -First-trimester anatomy including nuchal-translucency  measurement next week.  -Offer cell-free fetal DNA screening. ----------------------------------------------------------------------                  Noralee Space, MD Electronically Signed Final Report   05/24/2019 10:22 am ----------------------------------------------------------------------  US Abdomen Limited Ruq  Result Date: 05/23/2019 CLINICAL DATA:  Eleven weeks pregnant nausea and vomiting EXAM: ULTRASOUND ABDOMEN LIMITED RIGHT UPPER QUADRANT COMPARISON:  None. FINDINGS: Gallbladder: Filled with sludge. No shadowing stone. Normal wall thickness. Negative sonographic Murphy Common bile duct: Diameter: 5.4 mm Liver: No focal lesion identified. Within normal limits in parenchymal echogenicity. Portal vein is patent on color Doppler imaging with normal direction of blood flow towards the liver. Other: None. IMPRESSION: 1. Gallbladder sludge without acute inflammatory change by sonography. No biliary dilatation 2. Ultrasound appearance of the liver is within normal limits Electronically Signed   By: Jasmine Pang M.D.   On: 05/23/2019 21:52    Future Appointments  Date Time Provider Department Center  05/30/2019  3:15 PM [facility] NURSE [facility]  [facility]  05/30/2019  3:30 PM [place] 1 [facility]  05/30/2019  4:00 PM [place] [facility]    Discharge Condition: Stable  Discharge disposition: 01-Home or Self Care       Discharge Instructions    AMB referral to maternal fetal medicine   Complete by: As directed    Date Requested: 05/25/2019   Number of Fetus: One   Referral Type: [place] Physician Consult/Plan of Care Recommendations   Reason for Referral: Increased nuchal translucency noted on [redacted]w[redacted]d limited scan. needs first trimester screening     Allergies as of 05/26/2019   No Known Allergies     Medication List    TAKE these  medications   metoCLOPramide 10 MG tablet Commonly known as: REGLAN Take 1 tablet (10 mg total) by mouth every 6 (six) hours as needed for nausea or vomiting. What changed: reasons to take this   ondansetron 4 MG tablet Commonly known as: ZOFRAN Take 1 tablet (4 mg total) by mouth every 6 (six) hours as needed for nausea or vomiting.   promethazine 25 MG tablet Commonly known as: PHENERGAN Take 1 tablet (25 mg total) by mouth every 6 (six) hours as needed for nausea or vomiting.   scopolamine 1 MG/3DAYS Commonly known as: TRANSDERM-SCOP Place 1 patch (1.5 mg total) onto the skin every 3 (three) days.      Follow-up Information    [facility] Follow up on 06/01/2019.   Why: For initiation of prenatal care Contact information: [address] [place] [address] [phone]           Signed: Verita Schneiders M.D. 05/26/2019, 12:00 PM

## 2019-05-26 NOTE — Discharge Instructions (Signed)
Hyperemesis Gravidarum °Hyperemesis gravidarum is a severe form of nausea and vomiting that happens during pregnancy. Hyperemesis is worse than morning sickness. It may cause you to have nausea or vomiting all day for many days. It may keep you from eating and drinking enough food and liquids, which can lead to dehydration, malnutrition, and weight loss. Hyperemesis usually occurs during the first half (the first 20 weeks) of pregnancy. It often goes away once a woman is in her second half of pregnancy. However, sometimes hyperemesis continues through an entire pregnancy. °What are the causes? °The cause of this condition is not known. It may be related to changes in chemicals (hormones) in the body during pregnancy, such as the high level of pregnancy hormone (human chorionic gonadotropin) or the increase in the female sex hormone (estrogen). °What are the signs or symptoms? °Symptoms of this condition include: °· Nausea that does not go away. °· Vomiting that does not allow you to keep any food down. °· Weight loss. °· Body fluid loss (dehydration). °· Having no desire to eat, or not liking food that you have previously enjoyed. °How is this diagnosed? °This condition may be diagnosed based on: °· A physical exam. °· Your medical history. °· Your symptoms. °· Blood tests. °· Urine tests. °How is this treated? °This condition is managed by controlling symptoms. This may include: °· Following an eating plan. This can help lessen nausea and vomiting. °· Taking prescription medicines. °An eating plan and medicines are often used together to help control symptoms. If medicines do not help relieve nausea and vomiting, you may need to receive fluids through an IV at the hospital. °Follow these instructions at home: °Eating and drinking ° °· Avoid the following: °? Drinking fluids with meals. Try not to drink anything during the 30 minutes before and after your meals. °? Drinking more than 1 cup of fluid at a  time. °? Eating foods that trigger your symptoms. These may include spicy foods, coffee, high-fat foods, very sweet foods, and acidic foods. °? Skipping meals. Nausea can be more intense on an empty stomach. If you cannot tolerate food, do not force it. Try sucking on ice chips or other frozen items and make up for missed calories later. °? Lying down within 2 hours after eating. °? Being exposed to environmental triggers. These may include food smells, smoky rooms, closed spaces, rooms with strong smells, warm or humid places, overly loud and noisy rooms, and rooms with motion or flickering lights. Try eating meals in a well-ventilated area that is free of strong smells. °? Quick and sudden changes in your movement. °? Taking iron pills and multivitamins that contain iron. If you take prescription iron pills, do not stop taking them unless your health care provider approves. °? Preparing food. The smell of food can spoil your appetite or trigger nausea. °· To help relieve your symptoms: °? Listen to your body. Everyone is different and has different preferences. Find what works best for you. °? Eat and drink slowly. °? Eat 5-6 small meals daily instead of 3 large meals. Eating small meals and snacks can help you avoid an empty stomach. °? In the morning, before getting out of bed, eat a couple of crackers to avoid moving around on an empty stomach. °? Try eating starchy foods as these are usually tolerated well. Examples include cereal, toast, bread, potatoes, pasta, rice, and pretzels. °? Include at least 1 serving of protein with your meals and snacks. Protein options include   lean meats, poultry, seafood, beans, nuts, nut butters, eggs, cheese, and yogurt. °? Try eating a protein-rich snack before bed. Examples of a protein-rick snack include cheese and crackers or a peanut butter sandwich made with 1 slice of whole-wheat bread and 1 tsp (5 g) of peanut butter. °? Eat or suck on things that have ginger in them.  It may help relieve nausea. Add ¼ tsp ground ginger to hot tea or choose ginger tea. °? Try drinking 100% fruit juice or an electrolyte drink. An electrolyte drink contains sodium, potassium, and chloride. °? Drink fluids that are cold, clear, and carbonated or sour. Examples include lemonade, ginger ale, lemon-lime soda, ice water, and sparkling water. °? Brush your teeth or use a mouth rinse after meals. °? Talk with your health care provider about starting a supplement of vitamin B6. °General instructions °· Take over-the-counter and prescription medicines only as told by your health care provider. °· Follow instructions from your health care provider about eating or drinking restrictions. °· Continue to take your prenatal vitamins as told by your health care provider. If you are having trouble taking your prenatal vitamins, talk with your health care provider about different options. °· Keep all follow-up and pre-birth (prenatal) visits as told by your health care provider. This is important. °Contact a health care provider if: °· You have pain in your abdomen. °· You have a severe headache. °· You have vision problems. °· You are losing weight. °· You feel weak or dizzy. °Get help right away if: °· You cannot drink fluids without vomiting. °· You vomit blood. °· You have constant nausea and vomiting. °· You are very weak. °· You faint. °· You have a fever and your symptoms suddenly get worse. °Summary °· Hyperemesis gravidarum is a severe form of nausea and vomiting that happens during pregnancy. °· Making some changes to your eating habits may help relieve nausea and vomiting. °· This condition may be managed with medicine. °· If medicines do not help relieve nausea and vomiting, you may need to receive fluids through an IV at the hospital. °This information is not intended to replace advice given to you by your health care provider. Make sure you discuss any questions you have with your health care  provider. °Document Released: 07/20/2005 Document Revised: 08/09/2017 Document Reviewed: 03/18/2016 °Elsevier Patient Education © 2020 [employer] ° ° °

## 2019-05-30 ENCOUNTER — Other Ambulatory Visit: Payer: Self-pay

## 2019-05-30 ENCOUNTER — Ambulatory Visit (HOSPITAL_COMMUNITY): Admitting: *Deleted

## 2019-05-30 ENCOUNTER — Ambulatory Visit (HOSPITAL_BASED_OUTPATIENT_CLINIC_OR_DEPARTMENT_OTHER)
Admit: 2019-05-30 | Discharge: 2019-05-30 | Disposition: A | Discharge status: Home / Self Care | Attending: Obstetrics & Gynecology | Admitting: Obstetrics & Gynecology

## 2019-05-30 ENCOUNTER — Encounter (HOSPITAL_COMMUNITY): Payer: Self-pay | Admitting: *Deleted

## 2019-05-30 ENCOUNTER — Ambulatory Visit (HOSPITAL_COMMUNITY)

## 2019-05-30 VITALS — BP 118/61 | HR 114 | Temp 98.2°F | Wt 144.8 lb

## 2019-05-30 DIAGNOSIS — Z3A12 12 weeks gestation of pregnancy: Secondary | ICD-10-CM | POA: Diagnosis not present

## 2019-05-30 DIAGNOSIS — Z369 Encounter for antenatal screening, unspecified: Secondary | ICD-10-CM

## 2019-05-30 DIAGNOSIS — O211 Hyperemesis gravidarum with metabolic disturbance: Secondary | ICD-10-CM

## 2019-05-30 DIAGNOSIS — O283 Abnormal ultrasonic finding on antenatal screening of mother: Secondary | ICD-10-CM

## 2019-06-11 ENCOUNTER — Inpatient Hospital Stay (HOSPITAL_COMMUNITY)
Admission: AD | Admit: 2019-06-11 | Discharge: 2019-06-15 | DRG: 832 | Disposition: A | Discharge status: Home / Self Care | Attending: Obstetrics and Gynecology | Admitting: Obstetrics and Gynecology

## 2019-06-11 ENCOUNTER — Encounter (HOSPITAL_COMMUNITY): Payer: Self-pay | Admitting: *Deleted

## 2019-06-11 ENCOUNTER — Other Ambulatory Visit: Payer: Self-pay

## 2019-06-11 DIAGNOSIS — F1210 Cannabis abuse, uncomplicated: Secondary | ICD-10-CM | POA: Diagnosis present

## 2019-06-11 DIAGNOSIS — E876 Hypokalemia: Secondary | ICD-10-CM | POA: Diagnosis present

## 2019-06-11 DIAGNOSIS — O210 Mild hyperemesis gravidarum: Secondary | ICD-10-CM | POA: Diagnosis present

## 2019-06-11 DIAGNOSIS — R1116 Cannabis hyperemesis syndrome: Secondary | ICD-10-CM | POA: Diagnosis present

## 2019-06-11 DIAGNOSIS — E860 Dehydration: Secondary | ICD-10-CM | POA: Diagnosis present

## 2019-06-11 DIAGNOSIS — O99322 Drug use complicating pregnancy, second trimester: Secondary | ICD-10-CM | POA: Diagnosis present

## 2019-06-11 DIAGNOSIS — Z20828 Contact with and (suspected) exposure to other viral communicable diseases: Secondary | ICD-10-CM | POA: Diagnosis present

## 2019-06-11 DIAGNOSIS — Z3A14 14 weeks gestation of pregnancy: Secondary | ICD-10-CM

## 2019-06-11 DIAGNOSIS — O0990 Supervision of high risk pregnancy, unspecified, unspecified trimester: Secondary | ICD-10-CM

## 2019-06-11 DIAGNOSIS — O211 Hyperemesis gravidarum with metabolic disturbance: Principal | ICD-10-CM | POA: Diagnosis present

## 2019-06-11 DIAGNOSIS — F1290 Cannabis use, unspecified, uncomplicated: Secondary | ICD-10-CM | POA: Diagnosis present

## 2019-06-11 LAB — URINALYSIS, ROUTINE W REFLEX MICROSCOPIC
Bilirubin Urine: NEGATIVE
Glucose, UA: NEGATIVE mg/dL
Hgb urine dipstick: NEGATIVE
Ketones, ur: 80 mg/dL — AB
Leukocytes,Ua: NEGATIVE
Nitrite: NEGATIVE
Protein, ur: 100 mg/dL — AB
Specific Gravity, Urine: 1.025 (ref 1.005–1.030)
pH: 8 (ref 5.0–8.0)

## 2019-06-11 LAB — COMPREHENSIVE METABOLIC PANEL
ALT: 13 U/L (ref 0–44)
AST: 16 U/L (ref 15–41)
Albumin: 3.7 g/dL (ref 3.5–5.0)
Alkaline Phosphatase: 37 U/L — ABNORMAL LOW (ref 38–126)
Anion gap: 11 (ref 5–15)
BUN: <5 mg/dL — ABNORMAL LOW (ref 6–20)
CO2: 21 mmol/L — ABNORMAL LOW (ref 22–32)
Calcium: 9.1 mg/dL (ref 8.9–10.3)
Chloride: 104 mmol/L (ref 98–111)
Creatinine, Ser: 0.46 mg/dL (ref 0.44–1.00)
GFR calc Af Amer: >60 mL/min (ref >60)
GFR calc non Af Amer: >60 mL/min (ref >60)
Glucose, Bld: 124 mg/dL — ABNORMAL HIGH (ref 70–99)
Potassium: 3.3 mmol/L — ABNORMAL LOW (ref 3.5–5.1)
Sodium: 136 mmol/L (ref 135–145)
Total Bilirubin: 1.2 mg/dL (ref 0.3–1.2)
Total Protein: 6.6 g/dL (ref 6.5–8.1)

## 2019-06-11 LAB — CBC WITH DIFFERENTIAL/PLATELET
Abs Immature Granulocytes: 0.09 10*3/uL — ABNORMAL HIGH (ref 0.00–0.07)
Basophils Absolute: 0 10*3/uL (ref 0.0–0.1)
Basophils Relative: 0 %
Eosinophils Absolute: 0 10*3/uL (ref 0.0–0.5)
Eosinophils Relative: 0 %
HCT: 38.8 % (ref 36.0–46.0)
Hemoglobin: 13.1 g/dL (ref 12.0–15.0)
Immature Granulocytes: 1 %
Lymphocytes Relative: 5 %
Lymphs Abs: 0.8 10*3/uL (ref 0.7–4.0)
MCH: 30.4 pg (ref 26.0–34.0)
MCHC: 33.8 g/dL (ref 30.0–36.0)
MCV: 90 fL (ref 80.0–100.0)
Monocytes Absolute: 0.2 10*3/uL (ref 0.1–1.0)
Monocytes Relative: 1 %
Neutro Abs: 12.9 10*3/uL — ABNORMAL HIGH (ref 1.7–7.7)
Neutrophils Relative %: 93 %
Platelets: 209 10*3/uL (ref 150–400)
RBC: 4.31 MIL/uL (ref 3.87–5.11)
RDW: 13.7 % (ref 11.5–15.5)
WBC: 14 10*3/uL — ABNORMAL HIGH (ref 4.0–10.5)
nRBC: 0 % (ref 0.0–0.2)

## 2019-06-11 MED ORDER — PRENATAL MULTIVITAMIN CH: 1.0000 | ORAL_TABLET | Freq: Every day | ORAL | Status: DC

## 2019-06-11 MED ORDER — CALCIUM CARBONATE ANTACID 500 MG PO CHEW: 2.0000 | CHEWABLE_TABLET | ORAL | Status: DC | PRN

## 2019-06-11 MED ORDER — LACTATED RINGERS IV SOLN
INTRAVENOUS | Status: DC
  Administered 2019-06-11: 20:00:00 via INTRAVENOUS

## 2019-06-11 MED ORDER — METHYLPREDNISOLONE SODIUM SUCC 125 MG IJ SOLR
48.0000 mg | Freq: Once | INTRAMUSCULAR | Status: AC
  Administered 2019-06-11: 48 mg via INTRAVENOUS
  Filled 2019-06-11: qty 2

## 2019-06-11 MED ORDER — SCOPOLAMINE 1 MG/3DAYS TD PT72
1.0000 | MEDICATED_PATCH | TRANSDERMAL | Status: DC
  Administered 2019-06-13 – 2019-06-14 (×2): 1.5 mg via TRANSDERMAL
  Filled 2019-06-11 (×2): qty 1

## 2019-06-11 MED ORDER — METHYLPREDNISOLONE 4 MG PO TABS
8.0000 mg | ORAL_TABLET | Freq: Every day | ORAL | Status: DC
  Administered 2019-06-14: 18:00:00 8 mg via ORAL
  Filled 2019-06-11 (×3): qty 2

## 2019-06-11 MED ORDER — ONDANSETRON HCL 4 MG/2ML IJ SOLN
4.0000 mg | Freq: Four times a day (QID) | INTRAMUSCULAR | Status: DC | PRN
  Administered 2019-06-12 – 2019-06-13 (×3): 4 mg via INTRAVENOUS
  Filled 2019-06-11 (×3): qty 2

## 2019-06-11 MED ORDER — METHYLPREDNISOLONE 4 MG PO TABS
8.0000 mg | ORAL_TABLET | Freq: Every day | ORAL | Status: DC
  Filled 2019-06-11: qty 2

## 2019-06-11 MED ORDER — METHYLPREDNISOLONE 16 MG PO TABS
16.0000 mg | ORAL_TABLET | Freq: Every day | ORAL | Status: AC
  Administered 2019-06-12: 15:00:00 16 mg via ORAL
  Filled 2019-06-11 (×2): qty 1

## 2019-06-11 MED ORDER — METHYLPREDNISOLONE 16 MG PO TABS
16.0000 mg | ORAL_TABLET | Freq: Every day | ORAL | Status: AC
  Administered 2019-06-12 – 2019-06-15 (×3): 16 mg via ORAL
  Filled 2019-06-11 (×5): qty 1

## 2019-06-11 MED ORDER — PANTOPRAZOLE SODIUM 40 MG IV SOLR
40.0000 mg | Freq: Two times a day (BID) | INTRAVENOUS | Status: DC
  Administered 2019-06-12 – 2019-06-14 (×6): 40 mg via INTRAVENOUS
  Filled 2019-06-11 (×7): qty 40

## 2019-06-11 MED ORDER — ACETAMINOPHEN 325 MG PO TABS
650.0000 mg | ORAL_TABLET | ORAL | Status: DC | PRN
  Filled 2019-06-11: qty 2

## 2019-06-11 MED ORDER — METHYLPREDNISOLONE 4 MG PO TABS: 4.0000 mg | ORAL_TABLET | Freq: Every day | ORAL | Status: DC

## 2019-06-11 MED ORDER — METHYLPREDNISOLONE 16 MG PO TABS
16.0000 mg | ORAL_TABLET | Freq: Every day | ORAL | Status: AC
  Administered 2019-06-12 – 2019-06-13 (×2): 16 mg via ORAL
  Filled 2019-06-11 (×2): qty 1

## 2019-06-11 MED ORDER — PANTOPRAZOLE SODIUM 40 MG IV SOLR
40.0000 mg | Freq: Once | INTRAVENOUS | Status: AC
  Administered 2019-06-11: 40 mg via INTRAVENOUS
  Filled 2019-06-11: qty 40

## 2019-06-11 MED ORDER — METOCLOPRAMIDE HCL 5 MG/ML IJ SOLN: 10.0000 mg | Freq: Four times a day (QID) | INTRAMUSCULAR | Status: DC | PRN

## 2019-06-11 MED ORDER — ZOLPIDEM TARTRATE 5 MG PO TABS: 5.0000 mg | ORAL_TABLET | Freq: Every evening | ORAL | Status: DC | PRN

## 2019-06-11 MED ORDER — DEXAMETHASONE SODIUM PHOSPHATE 10 MG/ML IJ SOLN
10.0000 mg | Freq: Once | INTRAMUSCULAR | Status: AC
  Administered 2019-06-11: 19:00:00 10 mg via INTRAVENOUS
  Filled 2019-06-11: qty 1

## 2019-06-11 MED ORDER — SODIUM CHLORIDE 0.9 % IV SOLN
8.0000 mg | Freq: Once | INTRAVENOUS | Status: AC
  Administered 2019-06-11: 8 mg via INTRAVENOUS
  Filled 2019-06-11: qty 4

## 2019-06-11 MED ORDER — DOCUSATE SODIUM 100 MG PO CAPS: 100.0000 mg | ORAL_CAPSULE | Freq: Two times a day (BID) | ORAL | Status: DC | PRN

## 2019-06-11 MED ORDER — METHYLPREDNISOLONE 4 MG PO TABS: 8.0000 mg | ORAL_TABLET | Freq: Every day | ORAL | Status: DC

## 2019-06-11 MED ORDER — KCL IN DEXTROSE-NACL 40-5-0.45 MEQ/L-%-% IV SOLN
INTRAVENOUS | Status: DC
  Administered 2019-06-11 – 2019-06-12 (×2): via INTRAVENOUS
  Filled 2019-06-11 (×2): qty 1000

## 2019-06-11 MED ORDER — PROMETHAZINE HCL 25 MG/ML IJ SOLN
25.0000 mg | Freq: Four times a day (QID) | INTRAMUSCULAR | Status: DC | PRN
  Administered 2019-06-12 – 2019-06-14 (×8): 25 mg via INTRAVENOUS
  Filled 2019-06-11 (×8): qty 1

## 2019-06-11 MED ORDER — PROMETHAZINE HCL 25 MG/ML IJ SOLN
25.0000 mg | Freq: Once | INTRAVENOUS | Status: AC
  Administered 2019-06-11: 16:00:00 25 mg via INTRAVENOUS
  Filled 2019-06-11: qty 1

## 2019-06-11 MED ORDER — PROCHLORPERAZINE EDISYLATE 10 MG/2ML IJ SOLN
10.0000 mg | Freq: Four times a day (QID) | INTRAMUSCULAR | Status: DC | PRN
  Administered 2019-06-11 – 2019-06-13 (×4): 10 mg via INTRAVENOUS
  Filled 2019-06-11 (×6): qty 2

## 2019-06-11 MED ORDER — POTASSIUM CHLORIDE 2 MEQ/ML IV SOLN
Freq: Once | INTRAVENOUS | Status: AC
  Administered 2019-06-11: 18:00:00 via INTRAVENOUS
  Filled 2019-06-11: qty 1000

## 2019-06-11 NOTE — MAU Note (Signed)
Jordan Murray is a 18 y.o. at [redacted]w[redacted]d here in MAU reporting:  +emesis Unable to tolerate eating anything since yesterday +lower abdominal pain. Constant.  Onset of complaint: ongoing Pain score: 4/10  Patient states she has been taking her medication on a schedule and still has not helped symptoms. Concerned over "flakes and bleeding" in her emesis. Vitals:   06/11/19 1430  BP: (!) 144/94  Pulse: 88  Resp: 20  Temp: 97.7 F (36.5 C)     Lab orders placed from triage: ua

## 2019-06-11 NOTE — Progress Notes (Signed)
Pharmacy Consult:   MEDROL (METHYLPREDNISOLONE) TAPER  FOR HYPEREMESIS GRAVIDARUM PATIENTS  The following is a 14 day taper of methylprednisolone for hyperemesis. Doses on day 1  will be given IV.  All doses starting on day 2  will be given PO. (If patient cannot tolerate oral medications, contact the pharmacy to change route to IV.)   Date Day Morning Midday Bedtime  11/8 1 - - 48 mg  11/9 2 16  mg 16 mg 16 mg  11/10 3 16  mg 16 mg 16 mg  11/11 4 16  mg 8 mg 16 mg  11/12 5 16  mg 8 mg 8 mg  11/13 6 8  mg 8 mg 8 mg  11/14 7 8  mg 4 mg 8 mg  11/15 8 8  mg 4 mg 4 mg  11/16 9 8  mg 4 mg   11/17 10 8  mg 4 mg   11/18 11 8  mg    11/19 12 8  mg    11/20 13 4  mg    11/21 14 4  mg     Check fasting blood sugars daily while on the taper. Notify MD if fasting blood sugar>95.  Jordan Hawks Kinzley Savell 06/11/2019

## 2019-06-11 NOTE — H&P (Addendum)
Chief Complaint: Emesis   First Provider Initiated Contact with Patient 06/11/19 1618     SUBJECTIVE HPI: Jordan Murray is a 18 y.o. G1P0 at [redacted]w[redacted]d who presents to Maternity Admissions reporting nausea and vomiting times several weeks.  Was hospitalized 05/23/2019 for hyperemesis with electrolyte imbalance and transaminitis that resolved Zofran, Phenergan, Reglan and scopolamine.  States she has been taking the medicine on schedule as directed, but has been unable to keep much of the medicine down for the last few days.  Associated signs and symptoms: Negative for fever, chills, diarrhea, sick contacts, loss of taste or smell.  Planned to start prenatal care at [facility] but was having problems with figuring out if they would take her insurance.  New OB has not been rescheduled yet.  Patient's mother states she had a negative Covid test 7-10 days ago at an urgent care.  Denies Covid symptoms.  Weights 9/26: 135 lb/61 kg 10/20: 116 lb/53 kg 10/27: 144 lb/65 kg 11/8: (wt after 2.5 liter fluid) 144 lb/65 kg   Past Medical History:  Diagnosis Date  . Medical history non-contributory    OB History  Gravida Para Term Preterm AB Living  1            SAB TAB Ectopic Multiple Live Births               # Outcome Date GA Lbr Len/2nd Weight Sex Delivery Anes PTL Lv  1 Current            Past Surgical History:  Procedure Laterality Date  . APPENDECTOMY     Social History   Socioeconomic History  . Marital status: Single    Spouse name: Not on file  . Number of children: Not on file  . Years of education: Not on file  . Highest education level: Not on file  Occupational History  . Not on file  Social Needs  . Financial resource strain: Not on file  . Food insecurity    Worry: Not on file    Inability: Not on file  . Transportation needs    Medical: Not on file    Non-medical: Not on file  Tobacco Use  . Smoking status: Never Smoker  . Smokeless tobacco: Former Chief Strategy Officer and Sexual Activity  . Alcohol use: Not Currently  . Drug use: Not Currently  . Sexual activity: Not Currently    Birth control/protection: None  Lifestyle  . Physical activity    Days per week: Not on file    Minutes per session: Not on file  . Stress: Not on file  Relationships  . Social Herbalist on phone: Not on file    Gets together: Not on file    Attends religious service: Not on file    Active member of club or organization: Not on file    Attends meetings of clubs or organizations: Not on file    Relationship status: Not on file  . Intimate partner violence    Fear of current or ex partner: Not on file    Emotionally abused: Not on file    Physically abused: Not on file    Forced sexual activity: Not on file  Other Topics Concern  . Not on file  Social History Narrative  . Not on file   History reviewed. No pertinent family history. No current facility-administered medications on file prior to encounter.    Current Outpatient Medications on File Prior to Encounter  Medication Sig Dispense Refill  . metoCLOPramide (REGLAN) 10 MG tablet Take 1 tablet (10 mg total) by mouth every 6 (six) hours as needed for nausea or vomiting. 30 tablet 3  . ondansetron (ZOFRAN) 4 MG tablet Take 1 tablet (4 mg total) by mouth every 6 (six) hours as needed for nausea or vomiting. 30 tablet 2  . promethazine (PHENERGAN) 25 MG tablet Take 1 tablet (25 mg total) by mouth every 6 (six) hours as needed for nausea or vomiting. 30 tablet 2  . scopolamine (TRANSDERM-SCOP) 1 MG/3DAYS Place 1 patch (1.5 mg total) onto the skin every 3 (three) days. 10 patch 1   No Known Allergies  I have reviewed patient's Past Medical Hx, Surgical Hx, Family Hx, Social Hx, medications and allergies.   Review of Systems  Constitutional: Positive for fatigue. Negative for appetite change, chills and fever.  Gastrointestinal: Positive for nausea and vomiting. Negative for abdominal pain,  constipation and diarrhea.  Genitourinary: Negative for flank pain and vaginal bleeding.    OBJECTIVE Patient Vitals for the past 24 hrs:  BP Temp Temp src Pulse Resp SpO2 Weight  06/11/19 2106 138/86 98.5 F (36.9 C) Oral 74 18 100 % -  06/11/19 2040 - - - - - - 66 kg  06/11/19 2011 (!) 144/95 98.1 F (36.7 C) Oral (!) 103 19 - -  06/11/19 1610 - - - - - 100 % -  06/11/19 1603 (!) 143/92 - - 100 - - -  06/11/19 1430 (!) 144/94 97.7 F (36.5 C) Oral 88 20 - -   Constitutional: Well-developed, well-nourished female in mild distress.  Vomiting repeatedly. Cardiovascular: normal rate Respiratory: normal rate and effort.  GI: Abd soft, non-tender Neurologic: Alert and oriented x 4.  GU: No CVA tenderness  Fetal heart rate 159 by Doppler.  LAB RESULTS Results for orders placed or performed during the hospital encounter of 06/11/19 (from the past 24 hour(s))  Urinalysis, Routine w reflex microscopic     Status: Abnormal   Collection Time: 06/11/19  3:42 PM  Result Value Ref Range   Color, Urine YELLOW YELLOW   APPearance HAZY (A) CLEAR   Specific Gravity, Urine 1.025 1.005 - 1.030   pH 8.0 5.0 - 8.0   Glucose, UA NEGATIVE NEGATIVE mg/dL   Hgb urine dipstick NEGATIVE NEGATIVE   Bilirubin Urine NEGATIVE NEGATIVE   Ketones, ur 80 (A) NEGATIVE mg/dL   Protein, ur 621 (A) NEGATIVE mg/dL   Nitrite NEGATIVE NEGATIVE   Leukocytes,Ua NEGATIVE NEGATIVE   RBC / HPF 0-5 0 - 5 RBC/hpf   WBC, UA 0-5 0 - 5 WBC/hpf   Bacteria, UA RARE (A) NONE SEEN   Squamous Epithelial / LPF 6-10 0 - 5   Mucus PRESENT   CBC with Differential     Status: Abnormal   Collection Time: 06/11/19  3:42 PM  Result Value Ref Range   WBC 14.0 (H) 4.0 - 10.5 K/uL   RBC 4.31 3.87 - 5.11 MIL/uL   Hemoglobin 13.1 12.0 - 15.0 g/dL   HCT 30.8 65.7 - 84.6 %   MCV 90.0 80.0 - 100.0 fL   MCH 30.4 26.0 - 34.0 pg   MCHC 33.8 30.0 - 36.0 g/dL   RDW 96.2 95.2 - 84.1 %   Platelets 209 150 - 400 K/uL   nRBC 0.0 0.0 -  0.2 %   Neutrophils Relative % 93 %   Neutro Abs 12.9 (H) 1.7 - 7.7 K/uL   Lymphocytes Relative 5 %  Lymphs Abs 0.8 0.7 - 4.0 K/uL   Monocytes Relative 1 %   Monocytes Absolute 0.2 0.1 - 1.0 K/uL   Eosinophils Relative 0 %   Eosinophils Absolute 0.0 0.0 - 0.5 K/uL   Basophils Relative 0 %   Basophils Absolute 0.0 0.0 - 0.1 K/uL   Immature Granulocytes 1 %   Abs Immature Granulocytes 0.09 (H) 0.00 - 0.07 K/uL  Comprehensive metabolic panel     Status: Abnormal   Collection Time: 06/11/19  3:42 PM  Result Value Ref Range   Sodium 136 135 - 145 mmol/L   Potassium 3.3 (L) 3.5 - 5.1 mmol/L   Chloride 104 98 - 111 mmol/L   CO2 21 (L) 22 - 32 mmol/L   Glucose, Bld 124 (H) 70 - 99 mg/dL   BUN <5 (L) 6 - 20 mg/dL   Creatinine, Ser 1.610.46 0.44 - 1.00 mg/dL   Calcium 9.1 8.9 - 09.610.3 mg/dL   Total Protein 6.6 6.5 - 8.1 g/dL   Albumin 3.7 3.5 - 5.0 g/dL   AST 16 15 - 41 U/L   ALT 13 0 - 44 U/L   Alkaline Phosphatase 37 (L) 38 - 126 U/L   Total Bilirubin 1.2 0.3 - 1.2 mg/dL   GFR calc non Af Amer >60 >60 mL/min   GFR calc Af Amer >60 >60 mL/min   Anion gap 11 5 - 15    IMAGING NA  MAU COURSE Orders Placed This Encounter  Procedures  . Urinalysis, Routine w reflex microscopic  . CBC with Differential  . Comprehensive metabolic panel  . Insert peripheral IV   Meds ordered this encounter  Medications  . ondansetron (ZOFRAN) 8 mg in sodium chloride 0.9 % 50 mL IVPB  . promethazine (PHENERGAN) 25 mg in dextrose 5% lactated ringers 1,000 mL infusion  . lactated ringers 1,000 mL with multivitamins adult (INFUVITE ADULT) 10 mL, potassium chloride 20 mEq infusion  . pantoprazole (PROTONIX) injection 40 mg  . dexamethasone (DECADRON) injection 10 mg  . dextrose 5 % and 0.45 % NaCl with KCl 40 mEq/L infusion   Patient had brief improvement of nausea and vomiting after Zofran and Phenergan, but then resumed vomiting repeatedly after waking up.  Decadron given.  No improvement.  Patient  still actively vomiting.  Discussed with Dr. Lynetta MareAnyawu.  Will admit for Medrol taper.  MDM -Hyperemesis uncontrolled with Phenergan and Zofran and Decadron.  Patient still actively vomiting.  Requires inpatient management for parenteral fluids and medications.  -Hypokalemia.  20 mEQ KCl given so far.  ASSESSMENT Hyperemesis Hypokalemia Dehydration  [facility] with Dr. Macon LargeAnyanwu Medrol taper Compazine Phenergan Zofran UDS Order COVID test per Dr. Macon LargeAnyanwu due to pt frequent vomiting and possibly exposing staff.   Katrinka BlazingSmith, [place], [place] 06/11/2019  9:33 PM

## 2019-06-11 NOTE — MAU Provider Note (Signed)
Chief Complaint: Emesis   First Provider Initiated Contact with Patient 06/11/19 1618     SUBJECTIVE HPI: Jordan Murray is a 18 y.o. G1P0 at [redacted]w[redacted]d who presents to Maternity Admissions reporting nausea and vomiting times several weeks.  Was hospitalized 05/23/2019 for hyperemesis with electrolyte imbalance and transaminitis that resolved Zofran, Phenergan, Reglan and scopolamine.  States she has been taking the medicine on schedule as directed, but has been unable to keep much of the medicine down for the last few days.  Associated signs and symptoms: Negative for fever, chills, diarrhea, sick contacts, loss of taste or smell.  Planned to start prenatal care at [facility] but was having problems with figuring out if they would take her insurance.  New OB has not been rescheduled yet.  Patient's mother states she had a negative Covid test 7-10 days ago at an urgent care.  Denies Covid symptoms.   Past Medical History:  Diagnosis Date  . Medical history non-contributory    OB History  Gravida Para Term Preterm AB Living  1            SAB TAB Ectopic Multiple Live Births               # Outcome Date GA Lbr Len/2nd Weight Sex Delivery Anes PTL Lv  1 Current            Past Surgical History:  Procedure Laterality Date  . APPENDECTOMY     Social History   Socioeconomic History  . Marital status: Single    Spouse name: Not on file  . Number of children: Not on file  . Years of education: Not on file  . Highest education level: Not on file  Occupational History  . Not on file  Social Needs  . Financial resource strain: Not on file  . Food insecurity    Worry: Not on file    Inability: Not on file  . Transportation needs    Medical: Not on file    Non-medical: Not on file  Tobacco Use  . Smoking status: Never Smoker  . Smokeless tobacco: Former Engineer, water and Sexual Activity  . Alcohol use: Not Currently  . Drug use: Not Currently  . Sexual activity: Not  Currently    Birth control/protection: None  Lifestyle  . Physical activity    Days per week: Not on file    Minutes per session: Not on file  . Stress: Not on file  Relationships  . Social Musician on phone: Not on file    Gets together: Not on file    Attends religious service: Not on file    Active member of club or organization: Not on file    Attends meetings of clubs or organizations: Not on file    Relationship status: Not on file  . Intimate partner violence    Fear of current or ex partner: Not on file    Emotionally abused: Not on file    Physically abused: Not on file    Forced sexual activity: Not on file  Other Topics Concern  . Not on file  Social History Narrative  . Not on file   History reviewed. No pertinent family history. No current facility-administered medications on file prior to encounter.    Current Outpatient Medications on File Prior to Encounter  Medication Sig Dispense Refill  . metoCLOPramide (REGLAN) 10 MG tablet Take 1 tablet (10 mg total) by mouth every 6 (six)  hours as needed for nausea or vomiting. 30 tablet 3  . ondansetron (ZOFRAN) 4 MG tablet Take 1 tablet (4 mg total) by mouth every 6 (six) hours as needed for nausea or vomiting. 30 tablet 2  . promethazine (PHENERGAN) 25 MG tablet Take 1 tablet (25 mg total) by mouth every 6 (six) hours as needed for nausea or vomiting. 30 tablet 2  . scopolamine (TRANSDERM-SCOP) 1 MG/3DAYS Place 1 patch (1.5 mg total) onto the skin every 3 (three) days. 10 patch 1   No Known Allergies  I have reviewed patient's Past Medical Hx, Surgical Hx, Family Hx, Social Hx, medications and allergies.   Review of Systems  Constitutional: Positive for fatigue. Negative for appetite change, chills and fever.  Gastrointestinal: Positive for nausea and vomiting. Negative for abdominal pain, constipation and diarrhea.  Genitourinary: Negative for flank pain and vaginal bleeding.    OBJECTIVE Patient  Vitals for the past 24 hrs:  BP Temp Temp src Pulse Resp SpO2 Weight  06/11/19 2106 - 98.5 F (36.9 C) Oral - 18 100 % -  06/11/19 2040 - - - - - - 66 kg  06/11/19 2011 (!) 144/95 98.1 F (36.7 C) Oral (!) 103 19 - -  06/11/19 1610 - - - - - 100 % -  06/11/19 1603 (!) 143/92 - - 100 - - -  06/11/19 1430 (!) 144/94 97.7 F (36.5 C) Oral 88 20 - -   Constitutional: Well-developed, well-nourished female in mild distress.  Vomiting repeatedly. Cardiovascular: normal rate Respiratory: normal rate and effort.  GI: Abd soft, non-tender Neurologic: Alert and oriented x 4.  GU: No CVA tenderness  Fetal heart rate 159 by Doppler.  LAB RESULTS Results for orders placed or performed during the hospital encounter of 06/11/19 (from the past 24 hour(s))  Urinalysis, Routine w reflex microscopic     Status: Abnormal   Collection Time: 06/11/19  3:42 PM  Result Value Ref Range   Color, Urine YELLOW YELLOW   APPearance HAZY (A) CLEAR   Specific Gravity, Urine 1.025 1.005 - 1.030   pH 8.0 5.0 - 8.0   Glucose, UA NEGATIVE NEGATIVE mg/dL   Hgb urine dipstick NEGATIVE NEGATIVE   Bilirubin Urine NEGATIVE NEGATIVE   Ketones, ur 80 (A) NEGATIVE mg/dL   Protein, ur 100 (A) NEGATIVE mg/dL   Nitrite NEGATIVE NEGATIVE   Leukocytes,Ua NEGATIVE NEGATIVE   RBC / HPF 0-5 0 - 5 RBC/hpf   WBC, UA 0-5 0 - 5 WBC/hpf   Bacteria, UA RARE (A) NONE SEEN   Squamous Epithelial / LPF 6-10 0 - 5   Mucus PRESENT   CBC with Differential     Status: Abnormal   Collection Time: 06/11/19  3:42 PM  Result Value Ref Range   WBC 14.0 (H) 4.0 - 10.5 K/uL   RBC 4.31 3.87 - 5.11 MIL/uL   Hemoglobin 13.1 12.0 - 15.0 g/dL   HCT 38.8 36.0 - 46.0 %   MCV 90.0 80.0 - 100.0 fL   MCH 30.4 26.0 - 34.0 pg   MCHC 33.8 30.0 - 36.0 g/dL   RDW 13.7 11.5 - 15.5 %   Platelets 209 150 - 400 K/uL   nRBC 0.0 0.0 - 0.2 %   Neutrophils Relative % 93 %   Neutro Abs 12.9 (H) 1.7 - 7.7 K/uL   Lymphocytes Relative 5 %   Lymphs Abs 0.8  0.7 - 4.0 K/uL   Monocytes Relative 1 %   Monocytes Absolute 0.2 0.1 -  1.0 K/uL   Eosinophils Relative 0 %   Eosinophils Absolute 0.0 0.0 - 0.5 K/uL   Basophils Relative 0 %   Basophils Absolute 0.0 0.0 - 0.1 K/uL   Immature Granulocytes 1 %   Abs Immature Granulocytes 0.09 (H) 0.00 - 0.07 K/uL  Comprehensive metabolic panel     Status: Abnormal   Collection Time: 06/11/19  3:42 PM  Result Value Ref Range   Sodium 136 135 - 145 mmol/L   Potassium 3.3 (L) 3.5 - 5.1 mmol/L   Chloride 104 98 - 111 mmol/L   CO2 21 (L) 22 - 32 mmol/L   Glucose, Bld 124 (H) 70 - 99 mg/dL   BUN <5 (L) 6 - 20 mg/dL   Creatinine, Ser 1.610.46 0.44 - 1.00 mg/dL   Calcium 9.1 8.9 - 09.610.3 mg/dL   Total Protein 6.6 6.5 - 8.1 g/dL   Albumin 3.7 3.5 - 5.0 g/dL   AST 16 15 - 41 U/L   ALT 13 0 - 44 U/L   Alkaline Phosphatase 37 (L) 38 - 126 U/L   Total Bilirubin 1.2 0.3 - 1.2 mg/dL   GFR calc non Af Amer >60 >60 mL/min   GFR calc Af Amer >60 >60 mL/min   Anion gap 11 5 - 15    IMAGING   MAU COURSE Orders Placed This Encounter  Procedures  . Urinalysis, Routine w reflex microscopic  . CBC with Differential  . Comprehensive metabolic panel  . Insert peripheral IV   Meds ordered this encounter  Medications  . ondansetron (ZOFRAN) 8 mg in sodium chloride 0.9 % 50 mL IVPB  . promethazine (PHENERGAN) 25 mg in dextrose 5% lactated ringers 1,000 mL infusion  . lactated ringers 1,000 mL with multivitamins adult (INFUVITE ADULT) 10 mL, potassium chloride 20 mEq infusion  . pantoprazole (PROTONIX) injection 40 mg  . dexamethasone (DECADRON) injection 10 mg  . dextrose 5 % and 0.45 % NaCl with KCl 40 mEq/L infusion   Patient had brief improvement of nausea and vomiting after Zofran and Phenergan, but then resumed vomiting repeatedly after waking up.  Decadron given.  No improvement.  Patient still actively vomiting.  Discussed with Dr. Lynetta MareAnyawu.  Will admit for Medrol taper.  MDM -Hyperemesis uncontrolled with  Phenergan and Zofran and Decadron.  Patient still actively vomiting.  Requires inpatient management for parenteral fluids and medications.  -Hypokalemia.  20 mEQ KCl given so far.  ASSESSMENT Hyperemesis Hypokalemia Dehydration  [facility] per consult with Dr. Macon LargeAnyanwu Medrol taper Compazine Phenergan Zofran UDS  Katrinka BlazingSmith, [place], CNM 06/11/2019  9:23 PM

## 2019-06-12 ENCOUNTER — Encounter (HOSPITAL_COMMUNITY): Payer: Self-pay | Admitting: *Deleted

## 2019-06-12 ENCOUNTER — Encounter: Payer: Self-pay | Admitting: Obstetrics and Gynecology

## 2019-06-12 DIAGNOSIS — O0990 Supervision of high risk pregnancy, unspecified, unspecified trimester: Secondary | ICD-10-CM | POA: Insufficient documentation

## 2019-06-12 DIAGNOSIS — F1210 Cannabis abuse, uncomplicated: Secondary | ICD-10-CM | POA: Diagnosis present

## 2019-06-12 LAB — COMPREHENSIVE METABOLIC PANEL WITH GFR
ALT: 12 U/L (ref 0–44)
AST: 14 U/L — ABNORMAL LOW (ref 15–41)
Albumin: 3.4 g/dL — ABNORMAL LOW (ref 3.5–5.0)
Alkaline Phosphatase: 39 U/L (ref 38–126)
Anion gap: 8 (ref 5–15)
BUN: <5 mg/dL — ABNORMAL LOW (ref 6–20)
CO2: 21 mmol/L — ABNORMAL LOW (ref 22–32)
Calcium: 9.2 mg/dL (ref 8.9–10.3)
Chloride: 107 mmol/L (ref 98–111)
Creatinine, Ser: 0.35 mg/dL — ABNORMAL LOW (ref 0.44–1.00)
GFR calc Af Amer: >60 mL/min
GFR calc non Af Amer: >60 mL/min
Glucose, Bld: 151 mg/dL — ABNORMAL HIGH (ref 70–99)
Potassium: 4 mmol/L (ref 3.5–5.1)
Sodium: 136 mmol/L (ref 135–145)
Total Bilirubin: 1.3 mg/dL — ABNORMAL HIGH (ref 0.3–1.2)
Total Protein: 6.3 g/dL — ABNORMAL LOW (ref 6.5–8.1)

## 2019-06-12 LAB — CBC
HCT: 39.4 % (ref 36.0–46.0)
Hemoglobin: 13.2 g/dL (ref 12.0–15.0)
MCH: 30.1 pg (ref 26.0–34.0)
MCHC: 33.5 g/dL (ref 30.0–36.0)
MCV: 90 fL (ref 80.0–100.0)
Platelets: 209 10*3/uL (ref 150–400)
RBC: 4.38 MIL/uL (ref 3.87–5.11)
RDW: 13.9 % (ref 11.5–15.5)
WBC: 16.4 10*3/uL — ABNORMAL HIGH (ref 4.0–10.5)
nRBC: 0 % (ref 0.0–0.2)

## 2019-06-12 LAB — MAGNESIUM: Magnesium: 1.7 mg/dL (ref 1.7–2.4)

## 2019-06-12 LAB — RAPID URINE DRUG SCREEN, HOSP PERFORMED
Amphetamines: NOT DETECTED
Barbiturates: NOT DETECTED
Benzodiazepines: NOT DETECTED
Cocaine: NOT DETECTED
Opiates: NOT DETECTED
Tetrahydrocannabinol: POSITIVE — AB

## 2019-06-12 LAB — GLUCOSE, CAPILLARY: Glucose-Capillary: 153 mg/dL — ABNORMAL HIGH (ref 70–99)

## 2019-06-12 MED ORDER — LACTATED RINGERS IV SOLN
INTRAVENOUS | Status: DC
  Administered 2019-06-12 – 2019-06-13 (×2): via INTRAVENOUS

## 2019-06-12 MED ORDER — LACTATED RINGERS IV SOLN: INTRAVENOUS | Status: DC

## 2019-06-12 MED ORDER — VITAMIN B-6 25 MG PO TABS
25.0000 mg | ORAL_TABLET | Freq: Three times a day (TID) | ORAL | Status: DC
  Administered 2019-06-12 – 2019-06-15 (×6): 25 mg via ORAL
  Filled 2019-06-12 (×10): qty 1

## 2019-06-12 MED ORDER — METOCLOPRAMIDE HCL 5 MG/ML IJ SOLN
10.0000 mg | Freq: Four times a day (QID) | INTRAMUSCULAR | Status: DC
  Administered 2019-06-12 – 2019-06-14 (×9): 10 mg via INTRAVENOUS
  Filled 2019-06-12 (×10): qty 2

## 2019-06-12 NOTE — Progress Notes (Signed)
[facility] Attending Note  Subjective:  No emesis overnight. Slept well.  No LOF or vaginal bleeding.  No other symptoms  Admitted on 06/11/2019 for Hyperemesis gravidarum.    Objective:  Blood pressure (!) 109/48, pulse 95, temperature 98.2 F (36.8 C), temperature source Oral, resp. rate 17, weight 65.3 kg, SpO2 98 %. FHR 156 bpm Gen: NAD HENT: Normocephalic, atraumatic Lungs: Normal respiratory effort Heart: Regular rate noted Abdomen: NT gravid fundus, soft Cervix: Deferred Ext: 2+ DTRs, no edema, no cyanosis, negative Homan's sign  Results: CMP Latest Ref Rng & Units 06/12/2019 06/11/2019 05/26/2019  Glucose 70 - 99 mg/dL 151(H) 124(H) 91  BUN 6 - 20 mg/dL <5(L) <5(L) <5(L)  Creatinine 0.44 - 1.00 mg/dL 0.35(L) 0.46 0.58  Sodium 135 - 145 mmol/L 136 136 139  Potassium 3.5 - 5.1 mmol/L 4.0 3.3(L) 3.7  Chloride 98 - 111 mmol/L 107 104 112(H)  CO2 22 - 32 mmol/L 21(L) 21(L) 23  Calcium 8.9 - 10.3 mg/dL 9.2 9.1 8.2(L)  Total Protein 6.5 - 8.1 g/dL 6.3(L) 6.6 4.3(L)  Total Bilirubin 0.3 - 1.2 mg/dL 1.3(H) 1.2 1.1  Alkaline Phos 38 - 126 U/L 39 37(L) 39  AST 15 - 41 U/L 14(L) 16 30  ALT 0 - 44 U/L 12 13 73(H)   CBC Latest Ref Rng & Units 06/12/2019 06/11/2019 05/25/2019  WBC 4.0 - 10.5 K/uL 16.4(H) 14.0(H) 7.1  Hemoglobin 12.0 - 15.0 g/dL 13.2 13.1 12.8  Hematocrit 36.0 - 46.0 % 39.4 38.8 35.7(L)  Platelets 150 - 400 K/uL 209 209 164    Assessment & Plan:  18 y.o. G1P0 at [redacted]w[redacted]d admitted for HEG, also had hypokalemia on admission - Continue multiple antiemetics and Medrol taper for now - Will start to ADAT starting with clear liquids - Normal K this morning after repletion, normal Mg Continue close observation.   Verita Schneiders, MD, [place] for [employer], [place]

## 2019-06-12 NOTE — Progress Notes (Signed)
Arlys John, RN (night shift charge RN) contacted Dr Harolyn Rutherford to verify COVID test order. MD states pt had negative COVID test in urgent care on 11/8 & due to that, [facility] will hold testing patient as her office works on getting the negative lab results. If unable to obtain results in timely fashion & pt continues to be admitted on OBSC, COVID test will need to be collected.

## 2019-06-12 NOTE — Progress Notes (Signed)
Pt c/o vomiting and frequent spitting again.  Pt states that she was feeling better but it has now started to get worse again.  Pt asked if she could take a shower at some point because hot showers have been helping to reduce the nausea.  Pt educated on calling out to cover IV and states she will do so when she is ready top shower.

## 2019-06-13 DIAGNOSIS — F1290 Cannabis use, unspecified, uncomplicated: Secondary | ICD-10-CM | POA: Diagnosis present

## 2019-06-13 LAB — COMPREHENSIVE METABOLIC PANEL
ALT: 11 U/L (ref 0–44)
AST: 12 U/L — ABNORMAL LOW (ref 15–41)
Albumin: 2.9 g/dL — ABNORMAL LOW (ref 3.5–5.0)
Alkaline Phosphatase: 31 U/L — ABNORMAL LOW (ref 38–126)
Anion gap: 7 (ref 5–15)
BUN: <5 mg/dL — ABNORMAL LOW (ref 6–20)
CO2: 22 mmol/L (ref 22–32)
Calcium: 8.5 mg/dL — ABNORMAL LOW (ref 8.9–10.3)
Chloride: 106 mmol/L (ref 98–111)
Creatinine, Ser: 0.54 mg/dL (ref 0.44–1.00)
GFR calc Af Amer: >60 mL/min (ref >60)
GFR calc non Af Amer: >60 mL/min (ref >60)
Glucose, Bld: 97 mg/dL (ref 70–99)
Potassium: 3.6 mmol/L (ref 3.5–5.1)
Sodium: 135 mmol/L (ref 135–145)
Total Bilirubin: 1.2 mg/dL (ref 0.3–1.2)
Total Protein: 5.1 g/dL — ABNORMAL LOW (ref 6.5–8.1)

## 2019-06-13 LAB — SARS CORONAVIRUS 2 (TAT 6-24 HRS): SARS Coronavirus 2: NEGATIVE

## 2019-06-13 LAB — GLUCOSE, CAPILLARY: Glucose-Capillary: 82 mg/dL (ref 70–99)

## 2019-06-13 MED ORDER — DIPHENHYDRAMINE HCL 25 MG PO CAPS
25.0000 mg | ORAL_CAPSULE | Freq: Three times a day (TID) | ORAL | Status: DC
  Filled 2019-06-13 (×5): qty 1

## 2019-06-13 MED ORDER — ONDANSETRON HCL 4 MG/2ML IJ SOLN
4.0000 mg | Freq: Four times a day (QID) | INTRAMUSCULAR | Status: DC
  Administered 2019-06-13 – 2019-06-14 (×5): 4 mg via INTRAVENOUS
  Filled 2019-06-13 (×5): qty 2

## 2019-06-13 MED ORDER — METHYLPREDNISOLONE SODIUM SUCC 40 MG IJ SOLR
16.0000 mg | Freq: Three times a day (TID) | INTRAMUSCULAR | Status: AC
  Administered 2019-06-13 (×2): 16 mg via INTRAVENOUS
  Filled 2019-06-13 (×3): qty 0.4

## 2019-06-13 MED ORDER — LACTATED RINGERS IV SOLN
INTRAVENOUS | Status: DC
  Administered 2019-06-13: 50 mL/h via INTRAVENOUS

## 2019-06-13 MED ORDER — METHYLPREDNISOLONE SODIUM SUCC 40 MG IJ SOLR
16.0000 mg | Freq: Once | INTRAMUSCULAR | Status: AC
  Administered 2019-06-14: 08:00:00 16 mg via INTRAVENOUS
  Filled 2019-06-13: qty 0.4

## 2019-06-13 NOTE — Progress Notes (Signed)
Daily Antepartum Note  Admission Date: 06/11/2019 Current Date: 06/13/2019 9:05 AM  Jordan Murray is a 18 y.o. G1 @ [redacted]w[redacted]d, HD#3, admitted for n/v of pregnancy.  Pregnancy complicated by: Patient Active Problem List   Diagnosis Date Noted  . Supervision of high risk pregnancy, antepartum 06/12/2019  . Marijuana abuse 06/12/2019  . Hyperemesis gravidarum with metabolic disturbance, antepartum 06/11/2019  . Hypokalemia 05/23/2019  . Dehydration 05/23/2019    Overnight/24hr events:  Had a worse night and not feeling great this morning  Subjective:  See above. No preterm labor s/s.   Objective:    Current Vital Signs 24h Vital Sign Ranges  T 98.2 F (36.8 C) Temp  Avg: 98.1 F (36.7 C)  Min: 97.7 F (36.5 C)  Max: 98.2 F (36.8 C)  BP 112/60 BP  Min: 101/52  Max: 123/71  HR 89 Pulse  Avg: 74.6  Min: 59  Max: 89  RR 18 Resp  Avg: 17.8  Min: 17  Max: 18  SaO2 98 % Room Air SpO2  Avg: 99 %  Min: 98 %  Max: 100 %       24 Hour I/O Current Shift I/O  Time Ins Outs 11/09 0701 - 11/10 0700 In: 60 [P.O.:60] Out: 1000 [Urine:850] No intake/output data recorded.    Physical exam: General: Well nourished, well developed female in no acute distress. Abdomen: nttp Respiratory: respiratory distress Extremities: no clubbing, cyanosis or edema Skin: Warm and dry.   Medications: Current Facility-Administered Medications  Medication Dose Route Frequency Provider Last Rate Last Dose  . acetaminophen (TYLENOL) tablet 650 mg  650 mg Oral Q4H PRN Anyanwu, Ugonna A, MD      . lactated ringers infusion   Intravenous Continuous Anyanwu, Sallyanne Havers, MD   Stopped at 06/13/19 0839  . methylPREDNISolone (MEDROL) tablet 16 mg  16 mg Oral Q breakfast Anyanwu, Sallyanne Havers, MD   Stopped at 06/13/19 0837   Followed by  . [START ON 06/16/2019] methylPREDNISolone (MEDROL) tablet 8 mg  8 mg Oral Q breakfast Anyanwu, Sallyanne Havers, MD       Followed by  . [START ON 06/23/2019] methylPREDNISolone (MEDROL)  tablet 4 mg  4 mg Oral Q breakfast Anyanwu, Ugonna A, MD      . methylPREDNISolone (MEDROL) tablet 16 mg  16 mg Oral Q1400 Anyanwu, Ugonna A, MD   16 mg at 06/12/19 1523   Followed by  . [START ON 06/14/2019] methylPREDNISolone (MEDROL) tablet 8 mg  8 mg Oral Q1400 Anyanwu, Sallyanne Havers, MD       Followed by  . [START ON 06/17/2019] methylPREDNISolone (MEDROL) tablet 4 mg  4 mg Oral Q1400 Anyanwu, Ugonna A, MD      . methylPREDNISolone (MEDROL) tablet 16 mg  16 mg Oral QHS Anyanwu, Ugonna A, MD   16 mg at 06/12/19 2225   Followed by  . [START ON 06/15/2019] methylPREDNISolone (MEDROL) tablet 8 mg  8 mg Oral QHS Anyanwu, Sallyanne Havers, MD       Followed by  . [START ON 06/18/2019] methylPREDNISolone (MEDROL) tablet 4 mg  4 mg Oral QHS Anyanwu, Ugonna A, MD      . metoCLOPramide (REGLAN) injection 10 mg  10 mg Intravenous Q6H Aletha Halim, MD   10 mg at 06/13/19 0519  . ondansetron (ZOFRAN) injection 4 mg  4 mg Intravenous Q6H PRN Anyanwu, Sallyanne Havers, MD   4 mg at 06/13/19 0832  . pantoprazole (PROTONIX) injection 40 mg  40 mg Intravenous Q12H Anyanwu,  Jethro Bastos, MD   40 mg at 06/13/19 0519  . prenatal multivitamin tablet 1 tablet  1 tablet Oral Q1200 Anyanwu, Ugonna A, MD      . prochlorperazine (COMPAZINE) injection 10 mg  10 mg Intravenous Q6H PRN Anyanwu, Ugonna A, MD   10 mg at 06/12/19 0541  . promethazine (PHENERGAN) injection 25 mg  25 mg Intravenous Q6H PRN Anyanwu, Jethro Bastos, MD   25 mg at 06/13/19 0832  . scopolamine (TRANSDERM-SCOP) 1 MG/3DAYS 1.5 mg  1 patch Transdermal Q72H Anyanwu, Ugonna A, MD      . vitamin B-6 (pyridOXINE) tablet 25 mg  25 mg Oral Q8H Sumner Bing, MD   25 mg at 06/13/19 0519    Labs:  Recent Labs  Lab 06/11/19 1542 06/12/19 0537  WBC 14.0* 16.4*  HGB 13.1 13.2  HCT 38.8 39.4  PLT 209 209    Recent Labs  Lab 06/11/19 1542 06/12/19 0537 06/13/19 0518  NA 136 136 135  K 3.3* 4.0 3.6  CL 104 107 106  CO2 21* 21* 22  BUN <5* <5* <5*  CREATININE 0.46  0.35* 0.54  CALCIUM 9.1 9.2 8.5*  PROT 6.6 6.3* 5.1*  BILITOT 1.2 1.3* 1.2  ALKPHOS 37* 39 31*  ALT 13 12 11   AST 16 14* 12*  GLUCOSE 124* 151* 97    Radiology: none  Assessment & Plan:  Pt stable to worse.  *Pregnancy: no acute pregnancy issues *PPx: SCDs, OOB ad lib *FEN/GI: will adjust meds and put more scheduled meds on. Continue ivf for now.  *Dispo: when able to take more PO  . MD Attending Center for [facility])

## 2019-06-14 LAB — GLUCOSE, CAPILLARY: Glucose-Capillary: 98 mg/dL (ref 70–99)

## 2019-06-14 MED ORDER — BOOST / RESOURCE BREEZE PO LIQD CUSTOM
1.0000 | Freq: Three times a day (TID) | ORAL | Status: DC
  Filled 2019-06-14: qty 1

## 2019-06-14 MED ORDER — ONDANSETRON 4 MG PO TBDP
4.0000 mg | ORAL_TABLET | Freq: Three times a day (TID) | ORAL | Status: DC
  Administered 2019-06-14 – 2019-06-15 (×2): 4 mg via ORAL
  Filled 2019-06-14 (×2): qty 1

## 2019-06-14 MED ORDER — ZOLPIDEM TARTRATE 5 MG PO TABS
5.0000 mg | ORAL_TABLET | Freq: Every evening | ORAL | Status: DC | PRN
  Administered 2019-06-14: 5 mg via ORAL
  Filled 2019-06-14: qty 1

## 2019-06-14 MED ORDER — METOCLOPRAMIDE HCL 10 MG PO TABS
10.0000 mg | ORAL_TABLET | Freq: Four times a day (QID) | ORAL | Status: DC
  Administered 2019-06-14 – 2019-06-15 (×4): 10 mg via ORAL
  Filled 2019-06-14 (×4): qty 1

## 2019-06-14 MED ORDER — ENSURE ENLIVE PO LIQD
237.0000 mL | Freq: Two times a day (BID) | ORAL | Status: DC
  Administered 2019-06-14: 237 mL via ORAL
  Filled 2019-06-14 (×2): qty 237

## 2019-06-14 NOTE — Progress Notes (Signed)
CBG 98 this am.

## 2019-06-14 NOTE — Progress Notes (Signed)
Daily Antepartum Note  Admission Date: 06/11/2019 Current Date: 06/14/2019 11:02 AM  Jordan Murray is a 18 y.o. G1 @ [redacted]w[redacted]d, HD#4, admitted for n/v of pregnancy.  Pregnancy complicated by: Patient Active Problem List   Diagnosis Date Noted  . Cannabinoid hyperemesis syndrome 06/13/2019  . Supervision of high risk pregnancy, antepartum 06/12/2019  . Marijuana abuse 06/12/2019  . Hyperemesis gravidarum with metabolic disturbance, antepartum 06/11/2019  . Hypokalemia 05/23/2019  . Dehydration 05/23/2019    Overnight/24hr events:  Feeling better this morning. Had some slight n/v  Subjective:  See above. No preterm labor s/s.   Objective:    Current Vital Signs 24h Vital Sign Ranges  T 98.6 F (37 C) Temp  Avg: 98.4 F (36.9 C)  Min: 98.2 F (36.8 C)  Max: 98.7 F (37.1 C)  BP 138/77 BP  Min: 102/41  Max: 149/102  HR 96 Pulse  Avg: 80.7  Min: 65  Max: 102  RR 17 Resp  Avg: 17.5  Min: 16  Max: 18  SaO2 99 % Room Air SpO2  Avg: 98.8 %  Min: 96 %  Max: 100 %       24 Hour I/O Current Shift I/O  Time Ins Outs 11/10 0701 - 11/11 0700 In: 225 [P.O.:100; I.V.:125] Out: 1750 [Urine:1600] 11/11 0701 - 11/11 1900 In: -  Out: 450 [Urine:300]    Physical exam: General: Well nourished, well developed female in no acute distress. Abdomen: nttp Respiratory: respiratory distress Extremities: no clubbing, cyanosis or edema Skin: Warm and dry.   Medications: Current Facility-Administered Medications  Medication Dose Route Frequency Provider Last Rate Last Dose  . acetaminophen (TYLENOL) tablet 650 mg  650 mg Oral Q4H PRN Anyanwu, Ugonna A, MD      . diphenhydrAMINE (BENADRYL) capsule 25 mg  25 mg Oral Q8H Greer Bing, MD   Stopped at 06/14/19 1020  . lactated ringers infusion   Intravenous Continuous Isabela Bing, MD 50 mL/hr at 06/13/19 2229 50 mL/hr at 06/13/19 2229  . methylPREDNISolone (MEDROL) tablet 16 mg  16 mg Oral Q breakfast Anyanwu, Jethro Bastos, MD   Stopped at  06/13/19 0837   Followed by  . [START ON 06/16/2019] methylPREDNISolone (MEDROL) tablet 8 mg  8 mg Oral Q breakfast Anyanwu, Jethro Bastos, MD       Followed by  . [START ON 06/23/2019] methylPREDNISolone (MEDROL) tablet 4 mg  4 mg Oral Q breakfast Anyanwu, Ugonna A, MD      . methylPREDNISolone (MEDROL) tablet 8 mg  8 mg Oral Q1400 Anyanwu, Jethro Bastos, MD       Followed by  . [START ON 06/17/2019] methylPREDNISolone (MEDROL) tablet 4 mg  4 mg Oral Q1400 Anyanwu, Ugonna A, MD      . Melene Muller ON 06/15/2019] methylPREDNISolone (MEDROL) tablet 8 mg  8 mg Oral QHS Anyanwu, Ugonna A, MD       Followed by  . [START ON 06/18/2019] methylPREDNISolone (MEDROL) tablet 4 mg  4 mg Oral QHS Anyanwu, Ugonna A, MD      . metoCLOPramide (REGLAN) injection 10 mg  10 mg Intravenous Q6H [place] Bing, MD   10 mg at 06/14/19 0551  . ondansetron (ZOFRAN) injection 4 mg  4 mg Intravenous Q6H [place] Bing, MD   4 mg at 06/14/19 0806  . pantoprazole (PROTONIX) injection 40 mg  40 mg Intravenous Q12H Anyanwu, Ugonna A, MD   40 mg at 06/14/19 0443  . prochlorperazine (COMPAZINE) injection 10 mg  10 mg Intravenous Q6H PRN  Osborne Oman, MD   10 mg at 06/13/19 1940  . promethazine (PHENERGAN) injection 25 mg  25 mg Intravenous Q6H PRN Anyanwu, Ugonna A, MD   25 mg at 06/14/19 1020  . scopolamine (TRANSDERM-SCOP) 1 MG/3DAYS 1.5 mg  1 patch Transdermal Q72H Anyanwu, Ugonna A, MD   1.5 mg at 06/13/19 1202  . vitamin B-6 (pyridOXINE) tablet 25 mg  25 mg Oral Q8H Aletha Halim, MD   25 mg at 06/13/19 0519    Labs:  Recent Labs  Lab 06/11/19 1542 06/12/19 0537  WBC 14.0* 16.4*  HGB 13.1 13.2  HCT 38.8 39.4  PLT 209 209    Recent Labs  Lab 06/11/19 1542 06/12/19 0537 06/13/19 0518  NA 136 136 135  K 3.3* 4.0 3.6  CL 104 107 106  CO2 21* 21* 22  BUN <5* <5* <5*  CREATININE 0.46 0.35* 0.54  CALCIUM 9.1 9.2 8.5*  PROT 6.6 6.3* 5.1*  BILITOT 1.2 1.3* 1.2  ALKPHOS 37* 39 31*  ALT 13 12 11   AST 16 14* 12*   GLUCOSE 124* 151* 97    Radiology: none  Assessment & Plan:  Pt stable to worse.  *Pregnancy: no acute pregnancy issues *PPx: SCDs, OOB ad lib *FEN/GI: full liquid. ADAT. continue SLIV. If continues to do better, can transition to fully PO late afternoon. Pt amenable to seeing if nutrition shakes do okay with her. She has never seen nutrition and is amenable to talking to them.  *Dispo: possibly tomorrow  Durene Romans MD Attending Center for [employer] [facility])

## 2019-06-14 NOTE — Progress Notes (Signed)
Initial Nutrition Assessment  DOCUMENTATION CODES:  Not applicable  INTERVENTION:  F/L diet, Ensure Enlive Advance diet as tolerated, small freq low fat meals and snacks Resource Boost Breeze ordered, as pt has tolerated jello and italian ice today and this mimics similar flavor  Diet Education on morning sickness left at bedside - pt in shower- will attempt visit tomorrow  NUTRITION DIAGNOSIS:  Inadequate oral intake related to (hyperemesis) as evidenced by (weight history, N/V).  GOAL:  Patient will meet greater than or equal to 90% of their needs, Weight gain  MONITOR:  Weight trends, PO intake  REASON FOR ASSESSMENT:  Consult Hyperemesis  ASSESSMENT:  Now 14 4/7 weeks,with prev adm 9/26 and 10/20 for hyperemesis. Lowest wt recorded 116 lbs on 10/20. Now 143 lbs. BMI 24.6   Pt in shower when attempted visit.  Diet Order:   Diet Order            Diet full liquid Room service appropriate? Yes; Fluid consistency: Thin  Diet effective now             EDUCATION NEEDS:   (attempted diet education, pt in shower, will try again on 11/12)  Skin:  Skin Assessment: Reviewed RN Assessment Last BM:    Height:   Ht Readings from Last 1 Encounters:  06/12/19 5\' 4"  (1.626 m) (46 %, Z= -0.11)*   * Growth percentiles are based on CDC (Girls, 2-20 Years) data.   Weight:   Wt Readings from Last 1 Encounters:  06/13/19 65.1 kg (75 %, Z= 0.67)*   * Growth percentiles are based on CDC (Girls, 2-20 Years) data.    Ideal Body Weight:   120 lbs  BMI:  Body mass index is 24.63 kg/m.  Estimated Nutritional Needs:   Kcal:  1800-2000  Protein:  80-90 g  Fluid:  2.1 L    Weyman Rodney M.Fredderick Severance LDN Neonatal Nutrition Support Specialist/RD III Pager [phone]      Phone [phone]

## 2019-06-15 MED ORDER — METHYLPREDNISOLONE 4 MG PO TABS: 4.0000 mg | ORAL_TABLET | Freq: Every day | ORAL | 0 refills | Status: DC

## 2019-06-15 MED ORDER — METHYLPREDNISOLONE 8 MG PO TABS: 8.0000 mg | ORAL_TABLET | Freq: Every day | ORAL | 0 refills | Status: DC

## 2019-06-15 MED ORDER — PANTOPRAZOLE SODIUM 40 MG PO TBEC
40.0000 mg | DELAYED_RELEASE_TABLET | Freq: Every day | ORAL | Status: DC
  Administered 2019-06-15: 11:00:00 40 mg via ORAL
  Filled 2019-06-15: qty 1

## 2019-06-15 MED ORDER — METHYLPREDNISOLONE 16 MG PO TABS: 16.0000 mg | ORAL_TABLET | Freq: Every day | ORAL | 0 refills | Status: AC

## 2019-06-15 MED ORDER — SCOPOLAMINE 1 MG/3DAYS TD PT72: 1.0000 | MEDICATED_PATCH | TRANSDERMAL | 0 refills | Status: AC

## 2019-06-15 MED ORDER — PYRIDOXINE HCL 25 MG PO TABS: 25.0000 mg | ORAL_TABLET | Freq: Three times a day (TID) | ORAL | 1 refills | Status: AC

## 2019-06-15 MED ORDER — METHYLPREDNISOLONE 8 MG PO TABS: 8.0000 mg | ORAL_TABLET | Freq: Every day | ORAL | 0 refills | Status: AC

## 2019-06-15 MED ORDER — METOCLOPRAMIDE HCL 10 MG PO TABS: 10.0000 mg | ORAL_TABLET | Freq: Four times a day (QID) | ORAL | 1 refills | Status: AC

## 2019-06-15 MED ORDER — ONDANSETRON 4 MG PO TBDP: 4.0000 mg | ORAL_TABLET | Freq: Three times a day (TID) | ORAL | 1 refills | Status: AC

## 2019-06-15 MED ORDER — PANTOPRAZOLE SODIUM 40 MG PO TBEC: 40.0000 mg | DELAYED_RELEASE_TABLET | Freq: Every day | ORAL | 3 refills | Status: AC

## 2019-06-15 MED ORDER — ACETAMINOPHEN 325 MG PO TABS: 650.0000 mg | ORAL_TABLET | ORAL | Status: AC | PRN

## 2019-06-15 MED FILL — VITAMIN B6 50 MG TABS: 50 | 15 days supply | Qty: 23 | Fill #0

## 2019-06-15 MED FILL — ONDANSETRON ODT 4 MG TABLET: 4 | 15 days supply | Qty: 45 | Fill #0

## 2019-06-15 MED FILL — METHYLPREDNISOLONE 4 MG TAB: 4 | 6 days supply | Qty: 31 | Fill #0

## 2019-06-15 MED FILL — METOCLOPRAMIDE 10 MG TABLET: 10 | 10 days supply | Qty: 60 | Fill #0

## 2019-06-15 MED FILL — PANTOPRAZOLE SOD DR 40 MG T: 40 | 30 days supply | Qty: 30 | Fill #0

## 2019-06-15 MED FILL — SCOPOLAMINE 1 MG/3DAYS PT72: 1 | 21 days supply | Qty: 7 | Fill #0

## 2019-06-15 NOTE — Progress Notes (Signed)
Nutrition Follow-up  Discussion with Pt about diet recommendations for hyperemesis. Small freq lower fat meals, beverages in between meals. Pt had reviewed handout left afternoon before and has good understanding of diet modification. Questions answered.  Milk containing foods are currently causing stomach pain.  Pt reports pre-pregnancy wt of 165 lbs, and lowest weight first trimester of 119 lbs.  Weyman Rodney M.Fredderick Severance LDN Neonatal Nutrition Support Specialist/RD III Pager [phone]      Phone [phone]

## 2019-06-15 NOTE — Discharge Summary (Signed)
Discharge Summary   Admit Date: 06/11/2019 Discharge Date: 06/15/2019 Discharging Service: Antepartum  Primary OBGYN: None Admitting Physician: Tereso NewcomerUgonna A Anyanwu, MD  Discharge Physician: Vergie LivingPickens  Referring Provider: Maternity Admissions Unit  Admission Diagnoses: *Pregnancy 14/1 *Acute on chronic nausea and vomiting of pregnancy *[facility].   Discharge Diagnoses: *Resolving n/v of pregnancy  Consult Orders: METHYYPREDNISOLONE (MEDROL) TAPER FOR HYPEREMESIS [facility] CONSULT CONSULT TO DIETITIAN MEDS TO BEDS PHARMACY CONSULT ([facility] ONLY)   Surgeries/Procedures Performed: None  History and Physical: Attestation signed by Tereso NewcomerAnyanwu, Ugonna A, MD at 06/11/2019 9:53 PM  Attestation of Attending Supervision of Advanced Practice Provider (PA/CNM/NP): Evaluation and management procedures were performed by the Advanced Practice Provider under my supervision and collaboration.  I have reviewed the Advanced Practice Provider's note and chart, and I agree with the management and plan.  Jaynie CollinsUGONNA  ANYANWU, MD, FACOG Attending Obstetrician & Gynecologist, [facility], [facility]       Chief Complaint: Emesis   First Provider Initiated Contact with Patient 06/11/19 1618     SUBJECTIVE HPI: Jordan InchesShelbie Murray is a 18 y.o. G1P0 at [id number] who presents to Maternity Admissions reporting nausea and vomiting times several weeks.  Was hospitalized 05/23/2019 for hyperemesis with electrolyte imbalance and transaminitis that resolved Zofran, Phenergan, Reglan and scopolamine.  States she has been taking the medicine on schedule as directed, but has been unable to keep much of the medicine down for the last few days.  Associated signs and symptoms: Negative for fever, chills, diarrhea, sick contacts, loss of taste or smell.  [facility] but was having problems with figuring out if they  would take her insurance.  New OB has not been rescheduled yet.  Patient's mother states she had a negative Covid test 7-10 days ago at an urgent care.  Denies Covid symptoms.  Weights 9/26: 135 lb/61 kg 10/20: 116 lb/53 kg 10/27: 144 lb/65 kg 11/8: (wt after 2.5 liter fluid) 144 lb/65 kg       Past Medical History:  Diagnosis Date  . Medical history non-contributory                    OB History  Gravida Para Term Preterm AB Living  1            SAB TAB Ectopic Multiple Live Births                     # Outcome Date GA Lbr Len/2nd Weight Sex Delivery Anes PTL Lv  1 Current                 Past Surgical History:  Procedure Laterality Date  . APPENDECTOMY     Social History        Socioeconomic History  . Marital status: Single    Spouse name: Not on file  . Number of children: Not on file  . Years of education: Not on file  . Highest education level: Not on file  Occupational History  . Not on file  Social Needs  . Financial resource strain: Not on file  . Food insecurity    Worry: Not on file    Inability: Not on file  . Transportation needs    Medical: Not on file    Non-medical: Not on file  Tobacco Use  . Smoking status: Never Smoker  . Smokeless tobacco: Former Engineer, waterUser  Substance and Sexual  Activity  . Alcohol use: Not Currently  . Drug use: Not Currently  . Sexual activity: Not Currently    Birth control/protection: None  Lifestyle  . Physical activity    Days per week: Not on file    Minutes per session: Not on file  . Stress: Not on file  Relationships  . Social Herbalist on phone: Not on file    Gets together: Not on file    Attends religious service: Not on file    Active member of club or organization: Not on file    Attends meetings of clubs or organizations: Not on file    Relationship status: Not on file  . Intimate partner violence    Fear of current or ex partner: Not on  file    Emotionally abused: Not on file    Physically abused: Not on file    Forced sexual activity: Not on file  Other Topics Concern  . Not on file  Social History Narrative  . Not on file   History reviewed. No pertinent family history. No current facility-administered medications on file prior to encounter.          Current Outpatient Medications on File Prior to Encounter  Medication Sig Dispense Refill  . metoCLOPramide (REGLAN) 10 MG tablet Take 1 tablet (10 mg total) by mouth every 6 (six) hours as needed for nausea or vomiting. 30 tablet 3  . ondansetron (ZOFRAN) 4 MG tablet Take 1 tablet (4 mg total) by mouth every 6 (six) hours as needed for nausea or vomiting. 30 tablet 2  . promethazine (PHENERGAN) 25 MG tablet Take 1 tablet (25 mg total) by mouth every 6 (six) hours as needed for nausea or vomiting. 30 tablet 2  . scopolamine (TRANSDERM-SCOP) 1 MG/3DAYS Place 1 patch (1.5 mg total) onto the skin every 3 (three) days. 10 patch 1   No Known Allergies  I have reviewed patient's Past Medical Hx, Surgical Hx, Family Hx, Social Hx, medications and allergies.   Review of Systems  Constitutional: Positive for fatigue. Negative for appetite change, chills and fever.  Gastrointestinal: Positive for nausea and vomiting. Negative for abdominal pain, constipation and diarrhea.  Genitourinary: Negative for flank pain and vaginal bleeding.    OBJECTIVE Patient Vitals for the past 24 hrs:  BP Temp Temp src Pulse Resp SpO2 Weight  06/11/19 2106 138/86 98.5 F (36.9 C) Oral 74 18 100 % -  06/11/19 2040 - - - - - - 66 kg  06/11/19 2011 (!) 144/95 98.1 F (36.7 C) Oral (!) 103 19 - -  06/11/19 1610 - - - - - 100 % -  06/11/19 1603 (!) 143/92 - - 100 - - -  06/11/19 1430 (!) 144/94 97.7 F (36.5 C) Oral 88 20 - -   Constitutional: Well-developed, well-nourished female in mild distress.  Vomiting repeatedly. Cardiovascular: normal rate Respiratory: normal rate and  effort.  GI: Abd soft, non-tender Neurologic: Alert and oriented x 4.  GU: No CVA tenderness  Fetal heart rate 159 by Doppler.  LAB RESULTS      Results for orders placed or performed during the hospital encounter of 06/11/19 (from the past 24 hour(s))  Urinalysis, Routine w reflex microscopic     Status: Abnormal   Collection Time: 06/11/19  3:42 PM  Result Value Ref Range   Color, Urine YELLOW YELLOW   APPearance HAZY (A) CLEAR   Specific Gravity, Urine 1.025 1.005 - 1.030  pH 8.0 5.0 - 8.0   Glucose, UA NEGATIVE NEGATIVE mg/dL   Hgb urine dipstick NEGATIVE NEGATIVE   Bilirubin Urine NEGATIVE NEGATIVE   Ketones, ur 80 (A) NEGATIVE mg/dL   Protein, ur 409 (A) NEGATIVE mg/dL   Nitrite NEGATIVE NEGATIVE   Leukocytes,Ua NEGATIVE NEGATIVE   RBC / HPF 0-5 0 - 5 RBC/hpf   WBC, UA 0-5 0 - 5 WBC/hpf   Bacteria, UA RARE (A) NONE SEEN   Squamous Epithelial / LPF 6-10 0 - 5   Mucus PRESENT   CBC with Differential     Status: Abnormal   Collection Time: 06/11/19  3:42 PM  Result Value Ref Range   WBC 14.0 (H) 4.0 - 10.5 K/uL   RBC 4.31 3.87 - 5.11 MIL/uL   Hemoglobin 13.1 12.0 - 15.0 g/dL   HCT 81.1 91.4 - 78.2 %   MCV 90.0 80.0 - 100.0 fL   MCH 30.4 26.0 - 34.0 pg   MCHC 33.8 30.0 - 36.0 g/dL   RDW 95.6 21.3 - 08.6 %   Platelets 209 150 - 400 K/uL   nRBC 0.0 0.0 - 0.2 %   Neutrophils Relative % 93 %   Neutro Abs 12.9 (H) 1.7 - 7.7 K/uL   Lymphocytes Relative 5 %   Lymphs Abs 0.8 0.7 - 4.0 K/uL   Monocytes Relative 1 %   Monocytes Absolute 0.2 0.1 - 1.0 K/uL   Eosinophils Relative 0 %   Eosinophils Absolute 0.0 0.0 - 0.5 K/uL   Basophils Relative 0 %   Basophils Absolute 0.0 0.0 - 0.1 K/uL   Immature Granulocytes 1 %   Abs Immature Granulocytes 0.09 (H) 0.00 - 0.07 K/uL  Comprehensive metabolic panel     Status: Abnormal   Collection Time: 06/11/19  3:42 PM  Result Value Ref Range   Sodium 136 135 - 145 mmol/L   Potassium  3.3 (L) 3.5 - 5.1 mmol/L   Chloride 104 98 - 111 mmol/L   CO2 21 (L) 22 - 32 mmol/L   Glucose, Bld 124 (H) 70 - 99 mg/dL   BUN <5 (L) 6 - 20 mg/dL   Creatinine, Ser 5.78 0.44 - 1.00 mg/dL   Calcium 9.1 8.9 - 46.9 mg/dL   Total Protein 6.6 6.5 - 8.1 g/dL   Albumin 3.7 3.5 - 5.0 g/dL   AST 16 15 - 41 U/L   ALT 13 0 - 44 U/L   Alkaline Phosphatase 37 (L) 38 - 126 U/L   Total Bilirubin 1.2 0.3 - 1.2 mg/dL   GFR calc non Af Amer >60 >60 mL/min   GFR calc Af Amer >60 >60 mL/min   Anion gap 11 5 - 15    IMAGING NA  MAU COURSE    Orders Placed This Encounter  Procedures  . Urinalysis, Routine w reflex microscopic  . CBC with Differential  . Comprehensive metabolic panel  . Insert peripheral IV      Meds ordered this encounter  Medications  . ondansetron (ZOFRAN) 8 mg in sodium chloride 0.9 % 50 mL IVPB  . promethazine (PHENERGAN) 25 mg in dextrose 5% lactated ringers 1,000 mL infusion  . lactated ringers 1,000 mL with multivitamins adult (INFUVITE ADULT) 10 mL, potassium chloride 20 mEq infusion  . pantoprazole (PROTONIX) injection 40 mg  . dexamethasone (DECADRON) injection 10 mg  . dextrose 5 % and 0.45 % NaCl with KCl 40 mEq/L infusion   Patient had brief improvement of nausea and vomiting after Zofran  and Phenergan, but then resumed vomiting repeatedly after waking up.  Decadron given.  No improvement.  Patient still actively vomiting.  Discussed with Dr. Lynetta Mare.  Will admit for Medrol taper.  MDM -Hyperemesis uncontrolled with Phenergan and Zofran and Decadron.  Patient still actively vomiting.  Requires inpatient management for parenteral fluids and medications.  -Hypokalemia.  20 mEQ KCl given so far.  ASSESSMENT Hyperemesis Hypokalemia Dehydration  PLAN Admit to [facility] specialty care per consult with Dr. Macon Large Medrol taper Compazine Phenergan Zofran UDS Order COVID test per Dr. Macon Large due to pt frequent vomiting and possibly  exposing staff.   Katrinka Blazing, [place], CNM 06/11/2019  9:33 PM       Cosigned by: Tereso Newcomer, MD at 06/11/2019 9:53 PM  Electronically signed by Dorathy Kinsman, CNM at 06/11/2019 9:34 PM  Electronically signed by Dorathy Kinsman, CNM at 06/11/2019 9:39 PM  Electronically signed by Dorathy Kinsman, CNM at 06/11/2019 9:49 PM  Electronically signed by Tereso Newcomer, MD at 06/11/2019 9:53 PM  Hospital Course: Patient was continued on her steroid taper and transitioned to a regimen suitable for home. She was seen by Nutrtion and given recommendations. Patient denied THC use on admission. +UDS on admission.   Discharge Exam:   Current Vital Signs 24h Vital Sign Ranges  T 98.5 F (36.9 C) Temp  Avg: 98.1 F (36.7 C)  Min: 97.6 F (36.4 C)  Max: 98.5 F (36.9 C)  BP (!) 108/58 BP  Min: 108/58  Max: 150/91  HR 66 Pulse  Avg: 68  Min: 61  Max: 84  RR 18 Resp  Avg: 18  Min: 18  Max: 18  SaO2 99 % Room Air SpO2  Avg: 99.4 %  Min: 99 %  Max: 100 %       24 Hour I/O Current Shift I/O  Time Ins Outs 11/11 0701 - 11/12 0700 In: -  Out: 450 [Urine:300] No intake/output data recorded.   General appearance: Well nourished, well developed female in no acute distress.  Cardiovascular: S1, S2 normal, no murmur, rub or gallop, regular rate and rhythm Respiratory:  Clear to auscultation bilateral. Normal respiratory effort Abdomen: positive bowel sounds and no masses, hernias; diffusely non tender to palpation, non distended Neuro/Psych:  Normal mood and affect.  Skin:  Warm and dry.   Discharge Disposition:  Home  Patient Instructions:  Standard   Results Pending at Discharge:  None  Discharge Medications: Allergies as of 06/15/2019      Reactions   Lactose Intolerance (gi)       Medication List    STOP taking these medications   ondansetron 4 MG tablet Commonly known as: ZOFRAN   ondansetron 8 MG tablet Commonly known as: ZOFRAN     TAKE these medications    acetaminophen 325 MG tablet Commonly known as: TYLENOL Take 2 tablets (650 mg total) by mouth every 4 (four) hours as needed (for pain scale < 4  OR  temperature  >/=  100.5 F).   methylPREDNISolone 16 MG tablet Commonly known as: MEDROL Take 1 tablet (16 mg total) by mouth daily with breakfast for 4 days.   methylPREDNISolone 8 MG tablet Commonly known as: MEDROL Take 1 tablet (8 mg total) by mouth daily at 2 PM for 3 days.   methylPREDNISolone 8 MG tablet Commonly known as: MEDROL Take 1 tablet (8 mg total) by mouth at bedtime for 3 days.   methylPREDNISolone 8 MG tablet Commonly known as: MEDROL Take 1 tablet (  8 mg total) by mouth daily with breakfast for 7 days. Start taking on: June 16, 2019   methylPREDNISolone 4 MG tablet Commonly known as: MEDROL Take 1 tablet (4 mg total) by mouth daily at 2 PM for 4 days. Start taking on: June 17, 2019   methylPREDNISolone 4 MG tablet Commonly known as: MEDROL Take 1 tablet (4 mg total) by mouth at bedtime for 1 day. Start taking on: June 18, 2019   methylPREDNISolone 4 MG tablet Commonly known as: MEDROL Take 1 tablet (4 mg total) by mouth daily with breakfast for 2 days. Start taking on: June 23, 2019   metoCLOPramide 10 MG tablet Commonly known as: REGLAN Take 1 tablet (10 mg total) by mouth every 6 (six) hours. What changed:   when to take this  reasons to take this   ondansetron 4 MG disintegrating tablet Commonly known as: ZOFRAN-ODT Take 1 tablet (4 mg total) by mouth every 8 (eight) hours.   pantoprazole 40 MG tablet Commonly known as: PROTONIX Take 1 tablet (40 mg total) by mouth daily.   promethazine 25 MG tablet Commonly known as: PHENERGAN Take 1 tablet (25 mg total) by mouth every 6 (six) hours as needed for nausea or vomiting.   pyridOXINE 25 MG tablet Commonly known as: VITAMIN B-6 Take 1 tablet (25 mg total) by mouth every 8 (eight) hours.   scopolamine 1 MG/3DAYS Commonly known  as: TRANSDERM-SCOP Place 1 patch (1.5 mg total) onto the skin every 3 (three) days for 21 days. Start taking on: June 17, 2019        Future Appointments  Date Time Provider Department Center  06/19/2019  1:35 PM Levie Heritage, DO [facility]    Cornelia Copa MD Attending Center for [facility])

## 2019-06-19 ENCOUNTER — Inpatient Hospital Stay (HOSPITAL_COMMUNITY)
Admission: AD | Admit: 2019-06-19 | Discharge: 2019-06-22 | Disposition: A | Discharge status: Home / Self Care | Attending: Obstetrics & Gynecology | Admitting: Obstetrics & Gynecology

## 2019-06-19 ENCOUNTER — Other Ambulatory Visit: Payer: Self-pay

## 2019-06-19 ENCOUNTER — Ambulatory Visit (INDEPENDENT_AMBULATORY_CARE_PROVIDER_SITE_OTHER): Admitting: Family Medicine

## 2019-06-19 ENCOUNTER — Encounter: Payer: Self-pay | Admitting: Family Medicine

## 2019-06-19 ENCOUNTER — Encounter (HOSPITAL_COMMUNITY): Payer: Self-pay

## 2019-06-19 VITALS — Wt 129.1 lb

## 2019-06-19 DIAGNOSIS — O211 Hyperemesis gravidarum with metabolic disturbance: Secondary | ICD-10-CM | POA: Diagnosis present

## 2019-06-19 DIAGNOSIS — E876 Hypokalemia: Secondary | ICD-10-CM | POA: Diagnosis present

## 2019-06-19 DIAGNOSIS — O210 Mild hyperemesis gravidarum: Secondary | ICD-10-CM

## 2019-06-19 DIAGNOSIS — O2512 Malnutrition in pregnancy, second trimester: Secondary | ICD-10-CM | POA: Insufficient documentation

## 2019-06-19 DIAGNOSIS — Z113 Encounter for screening for infections with a predominantly sexual mode of transmission: Secondary | ICD-10-CM | POA: Diagnosis not present

## 2019-06-19 DIAGNOSIS — F12188 Cannabis abuse with other cannabis-induced disorder: Secondary | ICD-10-CM

## 2019-06-19 DIAGNOSIS — Z79899 Other long term (current) drug therapy: Secondary | ICD-10-CM | POA: Insufficient documentation

## 2019-06-19 DIAGNOSIS — E860 Dehydration: Secondary | ICD-10-CM | POA: Diagnosis present

## 2019-06-19 DIAGNOSIS — Z3A15 15 weeks gestation of pregnancy: Secondary | ICD-10-CM | POA: Diagnosis not present

## 2019-06-19 DIAGNOSIS — R1116 Cannabis hyperemesis syndrome: Secondary | ICD-10-CM | POA: Diagnosis present

## 2019-06-19 DIAGNOSIS — R112 Nausea with vomiting, unspecified: Secondary | ICD-10-CM | POA: Diagnosis present

## 2019-06-19 DIAGNOSIS — O0991 Supervision of high risk pregnancy, unspecified, first trimester: Secondary | ICD-10-CM

## 2019-06-19 DIAGNOSIS — O0990 Supervision of high risk pregnancy, unspecified, unspecified trimester: Secondary | ICD-10-CM

## 2019-06-19 DIAGNOSIS — F1210 Cannabis abuse, uncomplicated: Secondary | ICD-10-CM | POA: Diagnosis present

## 2019-06-19 DIAGNOSIS — R17 Unspecified jaundice: Secondary | ICD-10-CM

## 2019-06-19 DIAGNOSIS — A749 Chlamydial infection, unspecified: Secondary | ICD-10-CM | POA: Diagnosis present

## 2019-06-19 LAB — COMPREHENSIVE METABOLIC PANEL
ALT: 20 U/L (ref 0–44)
AST: 28 U/L (ref 15–41)
Albumin: 4.3 g/dL (ref 3.5–5.0)
Alkaline Phosphatase: 50 U/L (ref 38–126)
Anion gap: 15 (ref 5–15)
BUN: 5 mg/dL — ABNORMAL LOW (ref 6–20)
CO2: 24 mmol/L (ref 22–32)
Calcium: 9.5 mg/dL (ref 8.9–10.3)
Chloride: 91 mmol/L — ABNORMAL LOW (ref 98–111)
Creatinine, Ser: 0.46 mg/dL (ref 0.44–1.00)
GFR calc Af Amer: >60 mL/min (ref >60)
GFR calc non Af Amer: >60 mL/min (ref >60)
Glucose, Bld: 97 mg/dL (ref 70–99)
Potassium: 2.6 mmol/L — CL (ref 3.5–5.1)
Sodium: 130 mmol/L — ABNORMAL LOW (ref 135–145)
Total Bilirubin: 5.2 mg/dL — ABNORMAL HIGH (ref 0.3–1.2)
Total Protein: 7.2 g/dL (ref 6.5–8.1)

## 2019-06-19 LAB — BILIRUBIN, DIRECT: Bilirubin, Direct: 0.6 mg/dL — ABNORMAL HIGH (ref 0.0–0.2)

## 2019-06-19 LAB — TSH: TSH: 0.441 u[IU]/mL (ref 0.350–4.500)

## 2019-06-19 LAB — MAGNESIUM: Magnesium: 1.6 mg/dL — ABNORMAL LOW (ref 1.7–2.4)

## 2019-06-19 MED ORDER — PANTOPRAZOLE SODIUM 40 MG IV SOLR
40.0000 mg | Freq: Every day | INTRAVENOUS | Status: DC
  Administered 2019-06-19 – 2019-06-21 (×3): 40 mg via INTRAVENOUS
  Filled 2019-06-19 (×3): qty 40

## 2019-06-19 MED ORDER — GLYCOPYRROLATE 2 MG PO TABS: 2.0000 mg | ORAL_TABLET | Freq: Three times a day (TID) | ORAL | 3 refills | Status: AC | PRN

## 2019-06-19 MED ORDER — SODIUM CHLORIDE 0.9 % IV SOLN
8.0000 mg | Freq: Three times a day (TID) | INTRAVENOUS | Status: DC | PRN
  Administered 2019-06-21: 8 mg via INTRAVENOUS
  Filled 2019-06-19 (×2): qty 4

## 2019-06-19 MED ORDER — POTASSIUM CHLORIDE 10 MEQ/100ML IV SOLN
10.0000 meq | INTRAVENOUS | Status: AC
  Administered 2019-06-19 (×3): 10 meq via INTRAVENOUS
  Filled 2019-06-19 (×3): qty 100

## 2019-06-19 MED ORDER — PROMETHAZINE HCL 25 MG/ML IJ SOLN
25.0000 mg | Freq: Four times a day (QID) | INTRAMUSCULAR | Status: DC
  Administered 2019-06-19 – 2019-06-22 (×10): 25 mg via INTRAVENOUS
  Filled 2019-06-19 (×10): qty 1

## 2019-06-19 MED ORDER — SODIUM CHLORIDE 0.9 % IV SOLN
INTRAVENOUS | Status: DC
  Administered 2019-06-19 – 2019-06-20 (×3): via INTRAVENOUS
  Administered 2019-06-21: 125 mL/h via INTRAVENOUS
  Administered 2019-06-21: 14:00:00 via INTRAVENOUS
  Administered 2019-06-22: 125 mL/h via INTRAVENOUS

## 2019-06-19 MED ORDER — METOCLOPRAMIDE HCL 5 MG/ML IJ SOLN
10.0000 mg | Freq: Three times a day (TID) | INTRAMUSCULAR | Status: DC
  Administered 2019-06-19 – 2019-06-22 (×8): 10 mg via INTRAVENOUS
  Filled 2019-06-19 (×8): qty 2

## 2019-06-19 MED ORDER — SCOPOLAMINE 1 MG/3DAYS TD PT72
1.0000 | MEDICATED_PATCH | TRANSDERMAL | Status: DC
  Administered 2019-06-19: 1.5 mg via TRANSDERMAL
  Filled 2019-06-19: qty 1

## 2019-06-19 MED ORDER — VITAMIN B-6 25 MG PO TABS
25.0000 mg | ORAL_TABLET | Freq: Three times a day (TID) | ORAL | Status: DC
  Administered 2019-06-20 – 2019-06-22 (×4): 25 mg via ORAL
  Filled 2019-06-19 (×5): qty 1

## 2019-06-19 MED ORDER — PROMETHAZINE HCL 25 MG/ML IJ SOLN
25.0000 mg | Freq: Once | INTRAVENOUS | Status: AC
  Administered 2019-06-19: 25 mg via INTRAVENOUS
  Filled 2019-06-19: qty 1

## 2019-06-19 MED ORDER — M.V.I. ADULT IV INJ
Freq: Once | INTRAVENOUS | Status: AC
  Administered 2019-06-19: 18:00:00 via INTRAVENOUS
  Filled 2019-06-19: qty 1000

## 2019-06-19 MED ORDER — PROMETHAZINE HCL 25 MG/ML IJ SOLN: 25.0000 mg | Freq: Four times a day (QID) | INTRAMUSCULAR | Status: DC | PRN

## 2019-06-19 NOTE — H&P (Signed)
History   CSN: [id number]  Arrival date and time: 06/19/19 1459   First Provider Initiated Contact with Patient 06/19/19 1611         Chief Complaint  Patient presents with  . Morning Sickness   HPI  Jordan Murray is a 18 y.o. woman G1P0 with no significant medical history who presents to MAU directly from prenatal visit this afternoon for persistent nausea and vomiting and significant weight loss. Patient has a history of hyperemesis gravidarum and two associated admissions; recently discharged four days ago, 11/12. Upon discharge, patient prescribed steroid taper, ondansetron, pantoprazole, promethazine, pyridoxine, and scopolamine. She reports she has been taking her meds up until the noon dose today, which she missed.    Patient reports nausea and vomiting ongoing for three months now; mom reports that her last full solid meal was approximately two months ago. Patient has been unable to tolerate solid or liquids as it prompts vomiting; she reports vomiting about 20 times a day. Mom has attempted to give the patient Sprite, applesauce, jello, baby food, and Pedialyte, but she is only able to eat/drink very small amounts if any. Patient states that she urinates 1-2 times each day. Patient denies history of similar symptoms or experience of this outside of pregnancy. Patient denies improvement with medications, but does endorse relief with a "hot shower." Her last shower was this morning.   Patient denies alcohol or drug use. Mom shares that the patient previously tried a "little bit of weed" to ameliorate symptoms, but she no longer uses. 11/10 UDS positive for THC.           OB History    Gravida  1   Para      Term      Preterm      AB      Living        SAB      TAB      Ectopic      Multiple      Live Births                 Past Medical History:  Diagnosis Date  . Medical history non-contributory         Past Surgical History:  Procedure  Laterality Date  . APPENDECTOMY      History reviewed. No pertinent family history.  Social History       Tobacco Use  . Smoking status: Never Smoker  . Smokeless tobacco: Former Engineer, waterUser  Substance Use Topics  . Alcohol use: Not Currently  . Drug use: Not Currently    Allergies:      Allergies  Allergen Reactions  . Lactose Intolerance (Gi)            Medications Prior to Admission  Medication Sig Dispense Refill Last Dose  . acetaminophen (TYLENOL) 325 MG tablet Take 2 tablets (650 mg total) by mouth every 4 (four) hours as needed (for pain scale < 4  OR  temperature  >/=  100.5 F).   Past Week at Unknown time  . glycopyrrolate (ROBINUL) 2 MG tablet Take 1 tablet (2 mg total) by mouth 3 (three) times daily as needed. 30 tablet 3 Past Week at Unknown time  . methylPREDNISolone (MEDROL) 4 MG tablet Take 1 tablet (4 mg total) by mouth daily at 2 PM for 4 days. 4 tablet 0 06/19/2019 at Unknown time  . metoCLOPramide (REGLAN) 10 MG tablet Take 1 tablet (10 mg total) by mouth every 6 (  six) hours. 60 tablet 1 06/19/2019 at Unknown time  . ondansetron (ZOFRAN-ODT) 4 MG disintegrating tablet Take 1 tablet (4 mg total) by mouth every 8 (eight) hours. 45 tablet 1 06/19/2019 at Unknown time  . pantoprazole (PROTONIX) 40 MG tablet Take 1 tablet (40 mg total) by mouth daily. 30 tablet 3 06/18/2019 at Unknown time  . promethazine (PHENERGAN) 25 MG tablet Take 1 tablet (25 mg total) by mouth every 6 (six) hours as needed for nausea or vomiting. 30 tablet 2 06/19/2019 at Unknown time  . scopolamine (TRANSDERM-SCOP) 1 MG/3DAYS Place 1 patch (1.5 mg total) onto the skin every 3 (three) days for 21 days. 7 patch 0 06/19/2019 at Unknown time  . vitamin B-6 (VITAMIN B-6) 25 MG tablet Take 1 tablet (25 mg total) by mouth every 8 (eight) hours. 45 tablet 1 06/18/2019 at Unknown time  . [START ON 06/23/2019] methylPREDNISolone (MEDROL) 4 MG tablet Take 1 tablet (4 mg total) by mouth daily with  breakfast for 2 days. (Patient not taking: Reported on 06/19/2019) 2 tablet 0   . methylPREDNISolone (MEDROL) 4 MG tablet Take 1 tablet (4 mg total) by mouth at bedtime for 1 day. 1 tablet 0   . methylPREDNISolone (MEDROL) 8 MG tablet Take 1 tablet (8 mg total) by mouth daily with breakfast for 7 days. 7 tablet 0     Review of Systems  Constitutional: Negative for chills and fever.  Respiratory: Negative for cough.   Cardiovascular: Positive for palpitations.  Gastrointestinal: Positive for nausea and vomiting. Negative for abdominal pain, constipation and diarrhea.  Genitourinary: Positive for decreased urine volume. Negative for dysuria, hematuria, urgency, vaginal bleeding and vaginal discharge.  Neurological: Positive for light-headedness.     Physical Exam   Blood pressure (!) 134/98, pulse 99, temperature 98.7 F (37.1 C), temperature source Oral, resp. rate 19, height 5\' 4"  (1.626 m), weight 58.4 kg, SpO2 100 %.  Physical Exam Vitals signs and nursing note reviewed.  Constitutional:      Appearance: Normal appearance. She is normal weight. She is not toxic-appearing.     Comments: patient lying in bed in fetal position under several blankets, cooperative HENT:     Head: Normocephalic and atraumatic.     Mouth/Throat:     Mouth: Mucous membranes are moist.  Eyes:     Extraocular Movements: Extraocular movements intact.     Pupils: Pupils are equal, round, and reactive to light.  Cardiovascular:     Rate and Rhythm: Regular rhythm.  Pulmonary:     Effort: Pulmonary effort is normal.  Neurological:     Mental Status: She is alert and oriented to person, place, and time.     MAU Course  Procedures  MDM - inpatient treatment required for ongoing nausea and vomiting, anorexia, dehydration, malnutrition, weight loss - order for CMP to assess for electrolyte abnormalities and acid-base disturbances, LR fluid bolus with multivitamin for fluid resuscitation and  vitamin/mineral repletion, phenergan for nausea - CMP reveals several abnormalities; admission for severe potassium depletion 2.6.   Assessment and Plan  Jordan Murray is 18 y.o. woman G1P0 at [redacted]w[redacted]d who presents to MAU for nausea and vomiting ongoing for about three months. Prior admission for hyperemesis, sent home on steroid taper. Noted to be severely hypokalemic with significant weight loss (144 to 128 lbs) since discharge 06/15/19.   Mercy Moore 06/19/2019, 6:38 PM   I confirm that I have verified the information documented in the medical student's note and that I have  also personally reperformed the history, physical exam and all medical decision making activities of this service and have verified that all service and findings are accurately documented in this student's note.  Admission Diagnoses:  Dehydration  Cannabis hyperemesis syndrome concurrent with and due to cannabis abuse (HCC)  Hypokalemia - Admit to OBSCU for runs of KCl - Antenatal orders for HG  Raelyn Mora, CNM 06/19/2019 8:52 PM    Attestation of Attending Supervision of Advanced Practitioner (PA/CNM/NP): Evaluation, management, and procedures were performed by the Advanced Practitioner under my supervision and collaboration.  I have reviewed the Advanced Practitioner's note and chart, and I agree with the management and plan.  Patient with severe hypokalemia and dehydration, significant weight loss since admission. States she has been taking steroid taper (missed noon dose today) but has otherwise been compliant with meds and she is still having significant nausea/vomiting. Will admit for IV potassium and fluids, restart anti-emetic regimen. Have added on several labs as her total bilirubin is elevated, normal AST/ALT.   Recheck labs in AM   K. Therese Sarah, M.D. Attending [facility] for [employer] (Faculty Practice)  06/19/2019 10:01 PM

## 2019-06-19 NOTE — Progress Notes (Signed)
  Subjective:  Jordan Murray is a G1P0 [redacted]w[redacted]d being seen today for her first obstetrical visit.  Her obstetrical history is significant for hyperemesis gravidum. FOB involved, in [place]. Unintended pregnancy, but keeping. Patient does intend to breast feed. Pregnancy history fully reviewed.  Zofran every 4-6 hours B6 every 6hrs Phenergan PO Reglan every 6hours Steroids   Sometimes able to keep meds down.   Patient reports nausea and vomiting.  Wt 129 lb 1.6 oz (58.6 kg)   LMP  (LMP Unknown)   BMI 22.16 kg/m   HISTORY: OB History  Gravida Para Term Preterm AB Living  1            SAB TAB Ectopic Multiple Live Births               # Outcome Date GA Lbr Len/2nd Weight Sex Delivery Anes PTL Lv  1 Current             Past Medical History:  Diagnosis Date  . Medical history non-contributory     Past Surgical History:  Procedure Laterality Date  . APPENDECTOMY      No family history on file.   Exam  Wt 129 lb 1.6 oz (58.6 kg)   LMP  (LMP Unknown)   BMI 22.16 kg/m   Chaperone present during exam  CONSTITUTIONAL: Well-developed, well-nourished female in no acute distress.  HENT:  Normocephalic, atraumatic, External right and left ear normal. Oropharynx is clear and moist EYES: Conjunctivae and EOM are normal. Pupils are equal, round, and reactive to light. No scleral icterus.  NECK: Normal range of motion, supple, no masses.  Normal thyroid.  CARDIOVASCULAR: Normal heart rate noted, regular rhythm RESPIRATORY: Clear to auscultation bilaterally. Effort and breath sounds normal, no problems with respiration noted. BREASTS: declined ABDOMEN: Soft, normal bowel sounds, no distention noted.  No tenderness, rebound or guarding.  PELVIC: Normal appearing external genitalia. No abnormal discharge noted. 15 week uterus size MUSCULOSKELETAL: Normal range of motion. No tenderness.  No cyanosis, clubbing, or edema.  2+ distal pulses. SKIN: Skin is warm and dry. No rash noted.  Not diaphoretic. No erythema. No pallor. NEUROLOGIC: Alert and oriented to person, place, and time. Normal reflexes, muscle tone coordination. No cranial nerve deficit noted. PSYCHIATRIC: Normal mood and affect. Normal behavior. Normal judgment and thought content.    Assessment:    Pregnancy: G1P0 Patient Active Problem List   Diagnosis Date Noted  . Cannabinoid hyperemesis syndrome 06/13/2019  . Supervision of high risk pregnancy, antepartum 06/12/2019  . Marijuana abuse 06/12/2019  . Hyperemesis gravidarum with metabolic disturbance, antepartum 06/11/2019  . Hypokalemia 05/23/2019  . Dehydration 05/23/2019      Plan:   1. Supervision of high risk pregnancy, antepartum FHT and FH normal. - CHL AMB BABYSCRIPTS SCHEDULE OPTIMIZATION - GC/Chlamydia probe amp ([place])not at [facility] - Culture, OB Urine - Genetic Screening - Obstetric Panel, Including HIV   2. Hyperemesis Add robinul. Patient independently to go to MAU due to the amount of n/v she is having.  Problem list reviewed and updated. 75% of 30 min visit spent on counseling and coordination of care.     Truett Mainland 06/19/2019

## 2019-06-19 NOTE — MAU Note (Signed)
CRITICAL VALUE ALERT  Critical Value:  2.6  Date & Time Notied:  06/18/19 1806  Provider Notified: Sunday Corn CNM  Orders Received/Actions taken:

## 2019-06-19 NOTE — MAU Note (Signed)
.  Jordan Murray is a 18 y.o. at [redacted]w[redacted]d here in MAU reporting: vomiting since her discharge from [facility] on Friday 06/16/19. She cannot keep anything down and has constant nausea and vomiting. No VB present.  Pain score: 0  FHT:159

## 2019-06-19 NOTE — MAU Provider Note (Addendum)
History     CSN: [id number]  Arrival date and time: 06/19/19 1459   First Provider Initiated Contact with Patient 06/19/19 1611      Chief Complaint  Patient presents with  . Morning Sickness   HPI  Jordan Murray is a 18 y.o. woman G1P0 with no significant medical history who presents to MAU directly from prenatal visit this afternoon for persistent nausea and vomiting and significant weight loss. Patient has a history of hyperemesis gravidarum and two associated admissions; recently discharged four days ago, 11/12. Upon discharge, patient prescribed steroid taper, ondansetron, pantoprazole, promethazine, pyridoxine, and scopolamine. Patient reports nausea and vomiting ongoing for three months now; mom reports that her last full solid meal was approximately two months ago. Patient has been unable to tolerate solid or liquids as it prompts vomiting; she reports vomiting about 20 times a day. Mom has attempted to give the patient Sprite, applesauce, jello, baby food, and Pedialyte, but she is only able to eat/drink very small amounts if any. Patient states that she urinates 1-2 times each day. Patient denies history of similar symptoms or experience of this outside of pregnancy. Patient denies improvement with medications, but does endorse relief with a "hot shower." Her last shower was this morning.   Patient denies alcohol or drug use. Mom shares that the patient previously tried a "little bit of weed" to ameliorate symptoms, but she no longer uses. 11/10 UDS positive for THC.     OB History    Gravida  1   Para      Term      Preterm      AB      Living        SAB      TAB      Ectopic      Multiple      Live Births              Past Medical History:  Diagnosis Date  . Medical history non-contributory     Past Surgical History:  Procedure Laterality Date  . APPENDECTOMY      History reviewed. No pertinent family history.  Social History   Tobacco Use   . Smoking status: Never Smoker  . Smokeless tobacco: Former Engineer, water Use Topics  . Alcohol use: Not Currently  . Drug use: Not Currently    Allergies:  Allergies  Allergen Reactions  . Lactose Intolerance (Gi)     Medications Prior to Admission  Medication Sig Dispense Refill Last Dose  . acetaminophen (TYLENOL) 325 MG tablet Take 2 tablets (650 mg total) by mouth every 4 (four) hours as needed (for pain scale < 4  OR  temperature  >/=  100.5 F).   Past Week at Unknown time  . glycopyrrolate (ROBINUL) 2 MG tablet Take 1 tablet (2 mg total) by mouth 3 (three) times daily as needed. 30 tablet 3 Past Week at Unknown time  . methylPREDNISolone (MEDROL) 4 MG tablet Take 1 tablet (4 mg total) by mouth daily at 2 PM for 4 days. 4 tablet 0 06/19/2019 at Unknown time  . metoCLOPramide (REGLAN) 10 MG tablet Take 1 tablet (10 mg total) by mouth every 6 (six) hours. 60 tablet 1 06/19/2019 at Unknown time  . ondansetron (ZOFRAN-ODT) 4 MG disintegrating tablet Take 1 tablet (4 mg total) by mouth every 8 (eight) hours. 45 tablet 1 06/19/2019 at Unknown time  . pantoprazole (PROTONIX) 40 MG tablet Take 1 tablet (40 mg total)  by mouth daily. 30 tablet 3 06/18/2019 at Unknown time  . promethazine (PHENERGAN) 25 MG tablet Take 1 tablet (25 mg total) by mouth every 6 (six) hours as needed for nausea or vomiting. 30 tablet 2 06/19/2019 at Unknown time  . scopolamine (TRANSDERM-SCOP) 1 MG/3DAYS Place 1 patch (1.5 mg total) onto the skin every 3 (three) days for 21 days. 7 patch 0 06/19/2019 at Unknown time  . vitamin B-6 (VITAMIN B-6) 25 MG tablet Take 1 tablet (25 mg total) by mouth every 8 (eight) hours. 45 tablet 1 06/18/2019 at Unknown time  . [START ON 06/23/2019] methylPREDNISolone (MEDROL) 4 MG tablet Take 1 tablet (4 mg total) by mouth daily with breakfast for 2 days. (Patient not taking: Reported on 06/19/2019) 2 tablet 0   . methylPREDNISolone (MEDROL) 4 MG tablet Take 1 tablet (4 mg total) by  mouth at bedtime for 1 day. 1 tablet 0   . methylPREDNISolone (MEDROL) 8 MG tablet Take 1 tablet (8 mg total) by mouth daily with breakfast for 7 days. 7 tablet 0     Review of Systems  Constitutional: Negative for chills and fever.  Respiratory: Negative for cough.   Cardiovascular: Positive for palpitations.  Gastrointestinal: Positive for nausea and vomiting. Negative for abdominal pain, constipation and diarrhea.  Genitourinary: Positive for decreased urine volume. Negative for dysuria, hematuria, urgency, vaginal bleeding and vaginal discharge.  Neurological: Positive for light-headedness.     Physical Exam   Blood pressure (!) 134/98, pulse 99, temperature 98.7 F (37.1 C), temperature source Oral, resp. rate 19, height 5\' 4"  (1.626 m), weight 58.4 kg, SpO2 100 %.  Physical Exam Vitals signs and nursing note reviewed.  Constitutional:      Appearance: Normal appearance. She is normal weight. She is not toxic-appearing.     Comments: patient is tearful, she is lying in bed in fetal position under several blankets, she intermittently sit ups during our encounter  HENT:     Head: Normocephalic and atraumatic.     Mouth/Throat:     Mouth: Mucous membranes are moist.  Eyes:     Extraocular Movements: Extraocular movements intact.     Pupils: Pupils are equal, round, and reactive to light.  Cardiovascular:     Rate and Rhythm: Regular rhythm.  Pulmonary:     Effort: Pulmonary effort is normal.  Neurological:     Mental Status: She is alert and oriented to person, place, and time.     MAU Course  Procedures  MDM - inpatient treatment required for ongoing nausea and vomiting, anorexia, dehydration, malnutrition, weight loss - order for CMP to assess for electrolyte abnormalities and acid-base disturbances, LR fluid bolus with multivitamin for fluid resuscitation and vitamin/mineral repletion, phenergan for nausea - CMP reveals several abnormalities; admission for severe  potassium depletion 2.6.   Assessment and Plan  Phineas InchesShelbie Murray is 18 y.o. woman G1P0 at 164w2d who presents to MAU for nausea and vomiting ongoing for about three months. In reviewing patient's chart history, symptoms seem to be multifactorial, related to hyperemesis gravidarum and cannabinoid hyperemesis, as patient reports her symptoms are relived with hot showers. Patient's MAU CMP reveals multiple abnormalities, and most remarkably indicates severe hypokalemia at 2.6 requiring admission for repletion and management of her anorexia, dehydration, malnutrition, and significant weight loss.   Elveria Risingimelie Horne 06/19/2019, 6:38 PM   I confirm that I have verified the information documented in the medical student's note and that I have also personally reperformed the history, physical exam  and all medical decision making activities of this service and have verified that all service and findings are accurately documented in this student's note.   Admission Diagnoses:  Dehydration  Cannabis hyperemesis syndrome concurrent with and due to cannabis abuse ([place])  Hypokalemia - Admit to OBSCU for runs of KCl - Antenatal orders for HG - See Dr. Rosana Hoes' H&P documentation  Laury Deep, [place] 06/19/2019 8:52 PM

## 2019-06-19 NOTE — Addendum Note (Signed)
Addended by: Louisa Second E on: 06/19/2019 02:29 PM   Modules accepted: Orders

## 2019-06-20 ENCOUNTER — Inpatient Hospital Stay (HOSPITAL_COMMUNITY)

## 2019-06-20 DIAGNOSIS — Z3A15 15 weeks gestation of pregnancy: Secondary | ICD-10-CM | POA: Diagnosis not present

## 2019-06-20 DIAGNOSIS — O211 Hyperemesis gravidarum with metabolic disturbance: Secondary | ICD-10-CM | POA: Diagnosis not present

## 2019-06-20 DIAGNOSIS — A749 Chlamydial infection, unspecified: Secondary | ICD-10-CM | POA: Diagnosis present

## 2019-06-20 DIAGNOSIS — O98819 Other maternal infectious and parasitic diseases complicating pregnancy, unspecified trimester: Secondary | ICD-10-CM | POA: Diagnosis present

## 2019-06-20 LAB — COMPREHENSIVE METABOLIC PANEL
ALT: 14 U/L (ref 0–44)
AST: 15 U/L (ref 15–41)
Albumin: 3.3 g/dL — ABNORMAL LOW (ref 3.5–5.0)
Alkaline Phosphatase: 40 U/L (ref 38–126)
Anion gap: 12 (ref 5–15)
BUN: <5 mg/dL — ABNORMAL LOW (ref 6–20)
CO2: 23 mmol/L (ref 22–32)
Calcium: 8.4 mg/dL — ABNORMAL LOW (ref 8.9–10.3)
Chloride: 99 mmol/L (ref 98–111)
Creatinine, Ser: 0.44 mg/dL (ref 0.44–1.00)
GFR calc Af Amer: >60 mL/min (ref >60)
GFR calc non Af Amer: >60 mL/min (ref >60)
Glucose, Bld: 94 mg/dL (ref 70–99)
Potassium: 3 mmol/L — ABNORMAL LOW (ref 3.5–5.1)
Sodium: 134 mmol/L — ABNORMAL LOW (ref 135–145)
Total Bilirubin: 4.7 mg/dL — ABNORMAL HIGH (ref 0.3–1.2)
Total Protein: 5.6 g/dL — ABNORMAL LOW (ref 6.5–8.1)

## 2019-06-20 LAB — GC/CHLAMYDIA PROBE AMP (~~LOC~~) NOT AT ARMC
Chlamydia: POSITIVE — AB
Comment: NEGATIVE
Comment: NORMAL
Neisseria Gonorrhea: NEGATIVE

## 2019-06-20 LAB — BILIRUBIN, DIRECT: Bilirubin, Direct: 0.6 mg/dL — ABNORMAL HIGH (ref 0.0–0.2)

## 2019-06-20 LAB — MAGNESIUM: Magnesium: 1.8 mg/dL (ref 1.7–2.4)

## 2019-06-20 LAB — RAPID URINE DRUG SCREEN, HOSP PERFORMED
Amphetamines: NOT DETECTED
Barbiturates: NOT DETECTED
Benzodiazepines: NOT DETECTED
Cocaine: NOT DETECTED
Opiates: NOT DETECTED
Tetrahydrocannabinol: POSITIVE — AB

## 2019-06-20 LAB — URINALYSIS, ROUTINE W REFLEX MICROSCOPIC
Bilirubin Urine: NEGATIVE
Glucose, UA: NEGATIVE mg/dL
Hgb urine dipstick: NEGATIVE
Ketones, ur: 80 mg/dL — AB
Leukocytes,Ua: NEGATIVE
Nitrite: NEGATIVE
Protein, ur: NEGATIVE mg/dL
Specific Gravity, Urine: 1.011 (ref 1.005–1.030)
pH: 7 (ref 5.0–8.0)

## 2019-06-20 LAB — OBSTETRIC PANEL, INCLUDING HIV
Antibody Screen: NEGATIVE
Basophils Absolute: 0 10*3/uL (ref 0.0–0.2)
Basos: 0 %
EOS (ABSOLUTE): 0 10*3/uL (ref 0.0–0.4)
Eos: 0 %
HIV Screen 4th Generation wRfx: NONREACTIVE
Hematocrit: 44 % (ref 34.0–46.6)
Hemoglobin: 15.5 g/dL (ref 11.1–15.9)
Hepatitis B Surface Ag: NEGATIVE
Immature Grans (Abs): 0.2 10*3/uL — ABNORMAL HIGH (ref 0.0–0.1)
Immature Granulocytes: 1 %
Lymphocytes Absolute: 3.5 10*3/uL — ABNORMAL HIGH (ref 0.7–3.1)
Lymphs: 21 %
MCH: 30.8 pg (ref 26.6–33.0)
MCHC: 35.2 g/dL (ref 31.5–35.7)
MCV: 87 fL (ref 79–97)
Monocytes Absolute: 1.1 10*3/uL — ABNORMAL HIGH (ref 0.1–0.9)
Monocytes: 6 %
Neutrophils Absolute: 11.9 10*3/uL — ABNORMAL HIGH (ref 1.4–7.0)
Neutrophils: 72 %
Platelets: 312 10*3/uL (ref 150–450)
RBC: 5.04 x10E6/uL (ref 3.77–5.28)
RDW: 13.6 % (ref 11.7–15.4)
RPR Ser Ql: NONREACTIVE
Rh Factor: NEGATIVE
Rubella Antibodies, IGG: 6.36 index (ref >0.99)
WBC: 16.7 10*3/uL — ABNORMAL HIGH (ref 3.4–10.8)

## 2019-06-20 MED ORDER — METHYLPREDNISOLONE 4 MG PO TABS: 8.0000 mg | ORAL_TABLET | Freq: Every day | ORAL | Status: DC

## 2019-06-20 MED ORDER — METHYLPREDNISOLONE 16 MG PO TABS
16.0000 mg | ORAL_TABLET | Freq: Every day | ORAL | Status: DC
  Administered 2019-06-21 – 2019-06-22 (×2): 16 mg via ORAL
  Filled 2019-06-20 (×2): qty 1

## 2019-06-20 MED ORDER — METHYLPREDNISOLONE 16 MG PO TABS
16.0000 mg | ORAL_TABLET | Freq: Every day | ORAL | Status: DC
  Administered 2019-06-21: 16 mg via ORAL
  Filled 2019-06-20 (×2): qty 1

## 2019-06-20 MED ORDER — METHYLPREDNISOLONE SODIUM SUCC 40 MG IJ SOLR
16.0000 mg | Freq: Three times a day (TID) | INTRAMUSCULAR | Status: AC
  Administered 2019-06-20 (×3): 16 mg via INTRAVENOUS
  Filled 2019-06-20 (×3): qty 0.4

## 2019-06-20 MED ORDER — METHYLPREDNISOLONE 4 MG PO TABS: 4.0000 mg | ORAL_TABLET | Freq: Every day | ORAL | Status: DC

## 2019-06-20 MED ORDER — POTASSIUM CHLORIDE 10 MEQ/100ML IV SOLN
10.0000 meq | INTRAVENOUS | Status: AC
  Administered 2019-06-20 (×4): 10 meq via INTRAVENOUS
  Filled 2019-06-20 (×4): qty 100

## 2019-06-20 MED ORDER — DEXTROSE 5 % IV SOLN
1000.0000 mg | Freq: Once | INTRAVENOUS | Status: AC
  Administered 2019-06-21: 09:00:00 1000 mg via INTRAVENOUS
  Filled 2019-06-20: qty 1000

## 2019-06-20 MED ORDER — MAGNESIUM SULFATE 2 GM/50ML IV SOLN
2.0000 g | Freq: Once | INTRAVENOUS | Status: AC
  Administered 2019-06-20: 2 g via INTRAVENOUS
  Filled 2019-06-20: qty 50

## 2019-06-20 MED ORDER — PROCHLORPERAZINE EDISYLATE 10 MG/2ML IJ SOLN
10.0000 mg | Freq: Four times a day (QID) | INTRAMUSCULAR | Status: DC | PRN
  Administered 2019-06-20: 10 mg via INTRAVENOUS
  Filled 2019-06-20 (×2): qty 2

## 2019-06-20 NOTE — Progress Notes (Signed)
Patient ID: Jordan Murray, female   DOB: 2000-10-06, 18 y.o.   MRN: [id number]  Patient notified of + chlamydia test. Partner should be treated. No sex for 2 weeks after both are treated. Will give Azithromycin 1000mg  IV  Truett Mainland, DO

## 2019-06-20 NOTE — Progress Notes (Signed)
Faculty Practice OB/GYN Attending Note  Subjective:  Patient reports continued nausea this morning, but was able to tolerate a little water. She feels ill. Had RUQ u/s today, report pending. No LOF or vaginal bleeding.    Readmitted on 06/19/2019 for Hyperemesis gravidarum with metabolic disturbance. This is her third admission.    Objective:  Blood pressure 133/78, pulse 97, temperature 98.3 F (36.8 C), temperature source Oral, resp. rate 18, height 5\' 4"  (1.626 m), weight 58.4 kg, SpO2 100 %. FHR 152bpm Gen: In bed, looking tired and weak Lungs: Normal respiratory effort Heart: Regular rate noted Abdomen: NT, soft Cervix: Deferred Ext: 2+ DTRs, no edema, no cyanosis, negative Homan's sign  Labs: Results for orders placed or performed during the hospital encounter of 06/19/19 (from the past 24 hour(s))  cmp     Status: Abnormal   Collection Time: 06/19/19  4:34 PM  Result Value Ref Range   Sodium 130 (L) 135 - 145 mmol/L   Potassium 2.6 (LL) 3.5 - 5.1 mmol/L   Chloride 91 (L) 98 - 111 mmol/L   CO2 24 22 - 32 mmol/L   Glucose, Bld 97 70 - 99 mg/dL   BUN 5 (L) 6 - 20 mg/dL   Creatinine, Ser 06/21/19 0.44 - 1.00 mg/dL   Calcium 9.5 8.9 - 3.71 mg/dL   Total Protein 7.2 6.5 - 8.1 g/dL   Albumin 4.3 3.5 - 5.0 g/dL   AST 28 15 - 41 U/L   ALT 20 0 - 44 U/L   Alkaline Phosphatase 50 38 - 126 U/L   Total Bilirubin 5.2 (H) 0.3 - 1.2 mg/dL   GFR calc non Af Amer >60 >60 mL/min   GFR calc Af Amer >60 >60 mL/min   Anion gap 15 5 - 15  Bilirubin, direct     Status: Abnormal   Collection Time: 06/19/19 10:44 PM  Result Value Ref Range   Bilirubin, Direct 0.6 (H) 0.0 - 0.2 mg/dL  Magnesium     Status: Abnormal   Collection Time: 06/19/19 10:44 PM  Result Value Ref Range   Magnesium 1.6 (L) 1.7 - 2.4 mg/dL  TSH     Status: None   Collection Time: 06/19/19 10:44 PM  Result Value Ref Range   TSH 0.441 0.350 - 4.500 uIU/mL  Comprehensive metabolic panel     Status: Abnormal   Collection  Time: 06/20/19  5:30 AM  Result Value Ref Range   Sodium 134 (L) 135 - 145 mmol/L   Potassium 3.0 (L) 3.5 - 5.1 mmol/L   Chloride 99 98 - 111 mmol/L   CO2 23 22 - 32 mmol/L   Glucose, Bld 94 70 - 99 mg/dL   BUN <5 (L) 6 - 20 mg/dL   Creatinine, Ser 06/22/19 0.44 - 1.00 mg/dL   Calcium 8.4 (L) 8.9 - 10.3 mg/dL   Total Protein 5.6 (L) 6.5 - 8.1 g/dL   Albumin 3.3 (L) 3.5 - 5.0 g/dL   AST 15 15 - 41 U/L   ALT 14 0 - 44 U/L   Alkaline Phosphatase 40 38 - 126 U/L   Total Bilirubin 4.7 (H) 0.3 - 1.2 mg/dL   GFR calc non Af Amer >60 >60 mL/min   GFR calc Af Amer >60 >60 mL/min   Anion gap 12 5 - 15  Magnesium     Status: None   Collection Time: 06/20/19  5:30 AM  Result Value Ref Range   Magnesium 1.8 1.7 - 2.4 mg/dL  Bilirubin,  direct     Status: Abnormal   Collection Time: 06/20/19  5:30 AM  Result Value Ref Range   Bilirubin, Direct 0.6 (H) 0.0 - 0.2 mg/dL    Assessment & Plan:  18 y.o. G1P0 at [redacted]w[redacted]d re-admitted for HEG - Continue clear sips for now, may advance diet if tolerated later.   - Dr. Rosana Hoes was able to curbside GI last night about her elevated bilirubin.  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 Repeat RUQ ultrasound was recommended and done to re-evaluate for other etiologies, will follow up results and manage accordingly. - Given her concerning weight loss, continued HEG despite all interventions, discussed and recommended placement of feeding tube with patient. She does not want this for now, wants to see if she can tolerate more today and then decide later. - Undergoing repletion of potassium and magnesium, will recheck labs in am and continue to replete electrolytes as needed - Already ordered for various antiemetics, Medrol taper will be continued/re-started based on Pharmacy recommendations. - Continue close observation and routine  antenatal care.   Verita Schneiders, MD, [place] for [employer], [place]

## 2019-06-20 NOTE — Progress Notes (Signed)
Pharmacy Consult:   MEDROL (METHYLPREDNISOLONE) TAPER  FOR HYPEREMESIS GRAVIDARUM PATIENTS  The following is a 14 day taper of methylprednisolone for hyperemesis. Doses on day 1  will be given IV.  All doses starting on day 2  will be given PO. (If patient cannot tolerate oral medications, contact the pharmacy to change route to IV.)   Date Day Morning Midday Bedtime  11/17 1 16  mg 16 mg 16 mg  11/18 2 16  mg 16 mg 16 mg  11/19 3 16  mg 16 mg 16 mg  11/20 4 16  mg 8 mg 16 mg  11/21 5 16  mg 8 mg 8 mg  11/22 6 8  mg 8 mg 8 mg  11/23 7 8  mg 4 mg 8 mg  11/24 8 8  mg 4 mg 4 mg  11/25 9 8  mg 4 mg   11/26 10 8  mg 4 mg   11/27 11 8  mg    11/28 12 8  mg    11/29 13 4  mg    11/30 14 4  mg     Check fasting blood sugars daily while on the taper. Notify MD if fasting blood sugar>95.  Jordan Hawks Bryan Omura 06/20/2019

## 2019-06-21 DIAGNOSIS — Z3A15 15 weeks gestation of pregnancy: Secondary | ICD-10-CM | POA: Diagnosis not present

## 2019-06-21 DIAGNOSIS — O211 Hyperemesis gravidarum with metabolic disturbance: Secondary | ICD-10-CM | POA: Diagnosis not present

## 2019-06-21 LAB — CBC WITH DIFFERENTIAL/PLATELET
Abs Immature Granulocytes: 0.07 10*3/uL (ref 0.00–0.07)
Basophils Absolute: 0 10*3/uL (ref 0.0–0.1)
Basophils Relative: 0 %
Eosinophils Absolute: 0 10*3/uL (ref 0.0–0.5)
Eosinophils Relative: 0 %
HCT: 35 % — ABNORMAL LOW (ref 36.0–46.0)
Hemoglobin: 12.2 g/dL (ref 12.0–15.0)
Immature Granulocytes: 1 %
Lymphocytes Relative: 16 %
Lymphs Abs: 2 10*3/uL (ref 0.7–4.0)
MCH: 31.2 pg (ref 26.0–34.0)
MCHC: 34.9 g/dL (ref 30.0–36.0)
MCV: 89.5 fL (ref 80.0–100.0)
Monocytes Absolute: 0.5 10*3/uL (ref 0.1–1.0)
Monocytes Relative: 4 %
Neutro Abs: 9.5 10*3/uL — ABNORMAL HIGH (ref 1.7–7.7)
Neutrophils Relative %: 79 %
Platelets: 183 10*3/uL (ref 150–400)
RBC: 3.91 MIL/uL (ref 3.87–5.11)
RDW: 13.4 % (ref 11.5–15.5)
WBC: 12.1 10*3/uL — ABNORMAL HIGH (ref 4.0–10.5)
nRBC: 0 % (ref 0.0–0.2)

## 2019-06-21 LAB — URINE CULTURE, OB REFLEX

## 2019-06-21 LAB — COMPREHENSIVE METABOLIC PANEL
ALT: 14 U/L (ref 0–44)
AST: 29 U/L (ref 15–41)
Albumin: 3 g/dL — ABNORMAL LOW (ref 3.5–5.0)
Alkaline Phosphatase: 38 U/L (ref 38–126)
Anion gap: 11 (ref 5–15)
BUN: <5 mg/dL — ABNORMAL LOW (ref 6–20)
CO2: 15 mmol/L — ABNORMAL LOW (ref 22–32)
Calcium: 8.2 mg/dL — ABNORMAL LOW (ref 8.9–10.3)
Chloride: 106 mmol/L (ref 98–111)
Creatinine, Ser: 0.4 mg/dL — ABNORMAL LOW (ref 0.44–1.00)
GFR calc Af Amer: >60 mL/min (ref >60)
GFR calc non Af Amer: >60 mL/min (ref >60)
Glucose, Bld: 82 mg/dL (ref 70–99)
Potassium: 4.1 mmol/L (ref 3.5–5.1)
Sodium: 132 mmol/L — ABNORMAL LOW (ref 135–145)
Total Bilirubin: 3.6 mg/dL — ABNORMAL HIGH (ref 0.3–1.2)
Total Protein: 4.9 g/dL — ABNORMAL LOW (ref 6.5–8.1)

## 2019-06-21 LAB — LIPASE, BLOOD: Lipase: 19 U/L (ref 11–51)

## 2019-06-21 LAB — MAGNESIUM: Magnesium: 1.9 mg/dL (ref 1.7–2.4)

## 2019-06-21 LAB — CULTURE, OB URINE

## 2019-06-21 LAB — BILIRUBIN, DIRECT: Bilirubin, Direct: 0.6 mg/dL — ABNORMAL HIGH (ref 0.0–0.2)

## 2019-06-21 NOTE — Progress Notes (Signed)
Faculty Practice OB/GYN Attending Note  Subjective:  Patient reports she was able to tolerate clears yesterday, wants more food. Was told of infection yesterday.  No LOF or vaginal bleeding.  No abdominal pain.  Readmitted on 06/19/2019 for Hyperemesis gravidarum with metabolic disturbance. This is her third admission. Also diagnosed with chlamydia this admission.    Objective:  Blood pressure 124/72, pulse 73, temperature 98 F (36.7 C), temperature source Axillary, resp. rate 18, height 5\' 4"  (1.626 m), weight 61.8 kg, SpO2 99 %. FHR 153 bpm Gen: NAD Lungs: Normal respiratory effort Heart: Regular rate noted Abdomen: NT, soft Cervix: Deferred Ext: 2+ DTRs, no edema, no cyanosis, negative Homan's sign  Labs: Results for orders placed or performed during the hospital encounter of 06/19/19 (from the past 24 hour(s))  Urine rapid drug screen (hosp performed)     Status: Abnormal   Collection Time: 06/20/19 11:50 AM  Result Value Ref Range   Opiates NONE DETECTED NONE DETECTED   Cocaine NONE DETECTED NONE DETECTED   Benzodiazepines NONE DETECTED NONE DETECTED   Amphetamines NONE DETECTED NONE DETECTED   Tetrahydrocannabinol POSITIVE (A) NONE DETECTED   Barbiturates NONE DETECTED NONE DETECTED  Urinalysis, Routine w reflex microscopic     Status: Abnormal   Collection Time: 06/20/19 11:50 AM  Result Value Ref Range   Color, Urine YELLOW YELLOW   APPearance CLOUDY (A) CLEAR   Specific Gravity, Urine 1.011 1.005 - 1.030   pH 7.0 5.0 - 8.0   Glucose, UA NEGATIVE NEGATIVE mg/dL   Hgb urine dipstick NEGATIVE NEGATIVE   Bilirubin Urine NEGATIVE NEGATIVE   Ketones, ur 80 (A) NEGATIVE mg/dL   Protein, ur NEGATIVE NEGATIVE mg/dL   Nitrite NEGATIVE NEGATIVE   Leukocytes,Ua NEGATIVE NEGATIVE  Comprehensive metabolic panel     Status: Abnormal   Collection Time: 06/21/19  5:26 AM  Result Value Ref Range   Sodium 132 (L) 135 - 145 mmol/L   Potassium 4.1 3.5 - 5.1 mmol/L   Chloride 106  98 - 111 mmol/L   CO2 15 (L) 22 - 32 mmol/L   Glucose, Bld 82 70 - 99 mg/dL   BUN <5 (L) 6 - 20 mg/dL   Creatinine, Ser 06/23/19 (L) 0.44 - 1.00 mg/dL   Calcium 8.2 (L) 8.9 - 10.3 mg/dL   Total Protein 4.9 (L) 6.5 - 8.1 g/dL   Albumin 3.0 (L) 3.5 - 5.0 g/dL   AST 29 15 - 41 U/L   ALT 14 0 - 44 U/L   Alkaline Phosphatase 38 38 - 126 U/L   Total Bilirubin 3.6 (H) 0.3 - 1.2 mg/dL   GFR calc non Af Amer >60 >60 mL/min   GFR calc Af Amer >60 >60 mL/min   Anion gap 11 5 - 15  Lipase, blood     Status: None   Collection Time: 06/21/19  5:26 AM  Result Value Ref Range   Lipase 19 11 - 51 U/L  Magnesium     Status: None   Collection Time: 06/21/19  5:26 AM  Result Value Ref Range   Magnesium 1.9 1.7 - 2.4 mg/dL  Bilirubin, direct     Status: Abnormal   Collection Time: 06/21/19  5:26 AM  Result Value Ref Range   Bilirubin, Direct 0.6 (H) 0.0 - 0.2 mg/dL  CBC with Differential     Status: Abnormal   Collection Time: 06/21/19  5:26 AM  Result Value Ref Range   WBC 12.1 (H) 4.0 - 10.5 K/uL  RBC 3.91 3.87 - 5.11 MIL/uL   Hemoglobin 12.2 12.0 - 15.0 g/dL   HCT 35.0 (L) 36.0 - 46.0 %   MCV 89.5 80.0 - 100.0 fL   MCH 31.2 26.0 - 34.0 pg   MCHC 34.9 30.0 - 36.0 g/dL   RDW 13.4 11.5 - 15.5 %   Platelets 183 150 - 400 K/uL   nRBC 0.0 0.0 - 0.2 %   Neutrophils Relative % 79 %   Neutro Abs 9.5 (H) 1.7 - 7.7 K/uL   Lymphocytes Relative 16 %   Lymphs Abs 2.0 0.7 - 4.0 K/uL   Monocytes Relative 4 %   Monocytes Absolute 0.5 0.1 - 1.0 K/uL   Eosinophils Relative 0 %   Eosinophils Absolute 0.0 0.0 - 0.5 K/uL   Basophils Relative 0 %   Basophils Absolute 0.0 0.0 - 0.1 K/uL   Immature Granulocytes 1 %   Abs Immature Granulocytes 0.07 0.00 - 0.07 K/uL    Assessment & Plan:  18 y.o. G1P0 at [redacted]w[redacted]d re-admitted for HEG; also has chlamydia, +THC on UDS - Advance diet as tolerated, continue antiemetics as prescribed - Receiving Azithromycin IV, will inform partner as directed soon - SW consult  done for repeated THC positive on UDS and other issues - Stable electrolytes today, follow up tomorrow. - Continue close observation and routine antenatal care.   Verita Schneiders, MD, [place] for [employer], [place]

## 2019-06-22 DIAGNOSIS — Z3A15 15 weeks gestation of pregnancy: Secondary | ICD-10-CM | POA: Diagnosis not present

## 2019-06-22 DIAGNOSIS — O211 Hyperemesis gravidarum with metabolic disturbance: Secondary | ICD-10-CM | POA: Diagnosis not present

## 2019-06-22 LAB — COMPREHENSIVE METABOLIC PANEL
ALT: 15 U/L (ref 0–44)
AST: 11 U/L — ABNORMAL LOW (ref 15–41)
Albumin: 2.8 g/dL — ABNORMAL LOW (ref 3.5–5.0)
Alkaline Phosphatase: 35 U/L — ABNORMAL LOW (ref 38–126)
Anion gap: 10 (ref 5–15)
BUN: <5 mg/dL — ABNORMAL LOW (ref 6–20)
CO2: 20 mmol/L — ABNORMAL LOW (ref 22–32)
Calcium: 8.3 mg/dL — ABNORMAL LOW (ref 8.9–10.3)
Chloride: 105 mmol/L (ref 98–111)
Creatinine, Ser: 0.35 mg/dL — ABNORMAL LOW (ref 0.44–1.00)
GFR calc Af Amer: >60 mL/min (ref >60)
GFR calc non Af Amer: >60 mL/min (ref >60)
Glucose, Bld: 111 mg/dL — ABNORMAL HIGH (ref 70–99)
Potassium: 3.4 mmol/L — ABNORMAL LOW (ref 3.5–5.1)
Sodium: 135 mmol/L (ref 135–145)
Total Bilirubin: 1.8 mg/dL — ABNORMAL HIGH (ref 0.3–1.2)
Total Protein: 4.9 g/dL — ABNORMAL LOW (ref 6.5–8.1)

## 2019-06-22 LAB — MAGNESIUM: Magnesium: 1.6 mg/dL — ABNORMAL LOW (ref 1.7–2.4)

## 2019-06-22 LAB — GLUCOSE, CAPILLARY: Glucose-Capillary: 86 mg/dL (ref 70–99)

## 2019-06-22 MED ORDER — METHYLPREDNISOLONE 16 MG PO TABS: 16.0000 mg | ORAL_TABLET | Freq: Three times a day (TID) | ORAL | 0 refills | Status: AC

## 2019-06-22 MED ORDER — PROMETHAZINE HCL 25 MG RE SUPP: 25.0000 mg | Freq: Four times a day (QID) | RECTAL | 0 refills | Status: AC | PRN

## 2019-06-22 MED ORDER — PROCHLORPERAZINE MALEATE 10 MG PO TABS: 10.0000 mg | ORAL_TABLET | Freq: Four times a day (QID) | ORAL | 3 refills | Status: AC | PRN

## 2019-06-22 MED ORDER — METHYLPREDNISOLONE 8 MG PO TABS: 8.0000 mg | ORAL_TABLET | Freq: Three times a day (TID) | ORAL | 0 refills | Status: AC

## 2019-06-22 MED ORDER — METHYLPREDNISOLONE 4 MG PO TABS: 4.0000 mg | ORAL_TABLET | Freq: Three times a day (TID) | ORAL | 0 refills | Status: AC

## 2019-06-22 MED FILL — METHYLPREDNISOLONE 4 MG TAB: 4 | 12 days supply | Qty: 53 | Fill #0

## 2019-06-22 MED FILL — PROMETHAZINE HCL 25 MG SUPP: 25 | 3 days supply | Qty: 12 | Fill #0

## 2019-06-22 MED FILL — PROCHLORPERAZINE 10 MG TAB: 10 | 7 days supply | Qty: 30 | Fill #0

## 2019-06-22 NOTE — TOC Initial Note (Addendum)
Transition of Care [facility]) - Initial/Assessment Note    Patient Details  Name: Jordan Murray MRN: [id number] Date of Birth: March 06, 2001  Transition of Care [facility]) CM/SW Contact:    Lawerance Sabal, RN Phone Number: 06/22/2019, 10:54 AM  Clinical Narrative:                Patient will need IVF for HEG.  Patient states her mom works during the day and can come home for her lunch break but is home at 5pm daily. She states her mom is able and willing to assist with administration of IVF after she gets home from home. Explained what a PICC line is to the patient and she is agreeable that they could manage this at home.  Awaiting verification from [facility] agency. Pam w [facility] notified of patient and assisting with orders etc. [facility] will manage [facility] after discharge.   Addedum- notifed MD that [facility] could not be established. Multiple HH companies attempted and non would accept due to liability of pregnancy.    Expected Discharge Plan: Home w Home Health Services Barriers to Discharge: Continued Medical Work up(PICC line)   Patient Goals and CMS Choice Patient states their goals for this hospitalization and ongoing recovery are:: to go home      Expected Discharge Plan and Services Expected Discharge Plan: Home w Home Health Services     Post Acute Care Choice: Home Health   Expected Discharge Date: 06/22/19                         HH Arranged: RN HH Agency: [facility] Date [facility] Agency Contacted: 06/22/19 Time HH Agency Contacted: 1053 Representative spoke with at [facility] Agency: Kandee Keen  Prior Living Arrangements/Services   Lives with:: Parents                   Activities of Daily Living Home Assistive Devices/Equipment: None ADL Screening (condition at time of admission) Patient's cognitive ability adequate to safely complete daily activities?: Yes Is the patient deaf or have difficulty hearing?: No Does the patient have  difficulty seeing, even when wearing glasses/contacts?: No Does the patient have difficulty concentrating, remembering, or making decisions?: No Patient able to express need for assistance with ADLs?: Yes Does the patient have difficulty dressing or bathing?: No Independently performs ADLs?: Yes (appropriate for developmental age) Does the patient have difficulty walking or climbing stairs?: No Weakness of Legs: None Weakness of Arms/Hands: None  Permission Sought/Granted                  Emotional Assessment              Admission diagnosis:  dehydrated, nausea  Patient Active Problem List   Diagnosis Date Noted  . 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 Chlamydia infection affecting pregnancy 06/20/2019  . Cannabinoid hyperemesis syndrome 06/13/2019  . Supervision of high risk pregnancy, antepartum 06/12/2019  . Marijuana abuse 06/12/2019  . Hyperemesis gravidarum with metabolic disturbance, antepartum 06/11/2019  . Hypokalemia 05/23/2019  . Dehydration 05/23/2019   PCP:  Patient, No Pcp Per Pharmacy:   [id number] - [place], [place] - [address] AT [facility] [address] [place] [address] Phone: [phone] Fax: [phone]  [id number] [place], [place] - [address] AT [facility] [address] [place]  [address] Phone: [phone]  Fax: [phone]  [facility] [place], [place] - [address] [place] [address] Phone: [phone] Fax: [phone]     Social Determinants of Health ([place]) Interventions    Readmission Risk Interventions No flowsheet data found.

## 2019-06-22 NOTE — Discharge Summary (Signed)
Antenatal Physician Discharge Summary  Patient ID: Jordan Murray MRN: [id number] DOB/AGE: [birth year] 18 y.o.  Admit date: 06/19/2019 Discharge date: 06/22/2019  Admission Diagnoses: *Pregnancy at [redacted] weeks gestation *Acute on chronic nausea and vomiting of pregnancy *Hyperemesis due to THC use  *Hypokalemia *Dehydration *Elevated Bilirubin *Multiple admissions for hyperemesis gravidarum  Discharge Diagnoses: *Resolving hyperemesis gravidarum of pregnancy *Gilbert's syndrome *Chlamydia  Consult Orders: METHYLPREDNISOLONE (MEDROL) TAPER FOR HYPEREMESIS GRAVIDARUM PHARMACY CONSULT CASE MANAGEMENT AND HOME HEALTH SERVICES MEDS TO BEDS PHARMACY CONSULT ([facility] ONLY)   Hospital Course:  This is a 18 y.o. G1P0 with IUP at [redacted]w[redacted]d admitted for the third time for HEG. Continued on multiple medications to help with this, also had aggressive electrolyte repletion and IV fluid hydration.  Labs stabilized. Feeding tube placement was recommended, but she declined this and was able to tolerate oral intake.  She tolerated regular diet by time of discharge. Labs showed persistent THC use and also new diagnosis of chlamydia, this was treated and STI precautions given. Also diagnosed with Gilbert's syndrome given elevated bilirubin, negative RUQ imaging. If worsens, will need outpatient GI follow up. Case management was consulted to help with getting IV fluids at home to treat dehydration at home and hopefully decrease her need for admissions.  Four different Home Health Service Agencies declined helping her, felt that pregnancy was a liability and this was unable to be arranged for patient.    She was observed, fetal heart rate checks remained reassuring, and she had no signs/symptoms of progressing preterm labor or other maternal-fetal concerns.  She was deemed stable for discharge to home with outpatient follow up.  Patient is aware that if her symptoms worsen again, it may be necessary to place a feeding  tube.   Discharge Exam: Temp:  [97.6 F (36.4 C)-98.3 F (36.8 C)] 98.1 F (36.7 C) (11/19 0737) Pulse Rate:  [76-102] 76 (11/19 0737) Resp:  [16-18] 18 (11/19 0737) BP: (115-149)/(71-91) 128/73 (11/19 0737) SpO2:  [99 %-100 %] 99 % (11/19 0737) Weight:  [60.8 kg] 60.8 kg (11/19 0549)  Fetal monitoring: FHR 153 bpm  Physical Examination: CONSTITUTIONAL: Well-developed, well-nourished female in no acute distress.  HENT:  Normocephalic, atraumatic, External right and left ear normal. Oropharynx is clear and moist EYES: Conjunctivae and EOM are normal. Pupils are equal, round, and reactive to light. No scleral icterus.  NECK: Normal range of motion, supple, no masses SKIN: Skin is warm and dry. No rash noted. Not diaphoretic. No erythema. No pallor. NEUROLGIC: Alert and oriented to person, place, and time. Normal reflexes, muscle tone coordination. No cranial nerve deficit noted. PSYCHIATRIC: Normal mood and affect. Normal behavior. Normal judgment and thought content. CARDIOVASCULAR: Normal heart rate noted, regular rhythm RESPIRATORY: Effort and breath sounds normal, no problems with respiration noted MUSCULOSKELETAL: Normal range of motion. No edema and no tenderness. 2+ distal pulses. ABDOMEN: Soft, nontender, nondistended, gravid. CERVIX:  Deferred  Significant Diagnostic Studies:  Results for orders placed or performed during the hospital encounter of 06/19/19 (from the past 168 hour(s))  cmp   Collection Time: 06/19/19  4:34 PM  Result Value Ref Range   Sodium 130 (L) 135 - 145 mmol/L   Potassium 2.6 (LL) 3.5 - 5.1 mmol/L   Chloride 91 (L) 98 - 111 mmol/L   CO2 24 22 - 32 mmol/L   Glucose, Bld 97 70 - 99 mg/dL   BUN 5 (L) 6 - 20 mg/dL   Creatinine, Ser 1.19 0.44 - 1.00 mg/dL   Calcium 9.5  8.9 - 10.3 mg/dL   Total Protein 7.2 6.5 - 8.1 g/dL   Albumin 4.3 3.5 - 5.0 g/dL   AST 28 15 - 41 U/L   ALT 20 0 - 44 U/L   Alkaline Phosphatase 50 38 - 126 U/L   Total Bilirubin  5.2 (H) 0.3 - 1.2 mg/dL   GFR calc non Af Amer >60 >60 mL/min   GFR calc Af Amer >60 >60 mL/min   Anion gap 15 5 - 15  Bilirubin, direct   Collection Time: 06/19/19 10:44 PM  Result Value Ref Range   Bilirubin, Direct 0.6 (H) 0.0 - 0.2 mg/dL  Magnesium   Collection Time: 06/19/19 10:44 PM  Result Value Ref Range   Magnesium 1.6 (L) 1.7 - 2.4 mg/dL  TSH   Collection Time: 06/19/19 10:44 PM  Result Value Ref Range   TSH 0.441 0.350 - 4.500 uIU/mL  Comprehensive metabolic panel   Collection Time: 06/20/19  5:30 AM  Result Value Ref Range   Sodium 134 (L) 135 - 145 mmol/L   Potassium 3.0 (L) 3.5 - 5.1 mmol/L   Chloride 99 98 - 111 mmol/L   CO2 23 22 - 32 mmol/L   Glucose, Bld 94 70 - 99 mg/dL   BUN <5 (L) 6 - 20 mg/dL   Creatinine, Ser 1.61 0.44 - 1.00 mg/dL   Calcium 8.4 (L) 8.9 - 10.3 mg/dL   Total Protein 5.6 (L) 6.5 - 8.1 g/dL   Albumin 3.3 (L) 3.5 - 5.0 g/dL   AST 15 15 - 41 U/L   ALT 14 0 - 44 U/L   Alkaline Phosphatase 40 38 - 126 U/L   Total Bilirubin 4.7 (H) 0.3 - 1.2 mg/dL   GFR calc non Af Amer >60 >60 mL/min   GFR calc Af Amer >60 >60 mL/min   Anion gap 12 5 - 15  Magnesium   Collection Time: 06/20/19  5:30 AM  Result Value Ref Range   Magnesium 1.8 1.7 - 2.4 mg/dL  Bilirubin, direct   Collection Time: 06/20/19  5:30 AM  Result Value Ref Range   Bilirubin, Direct 0.6 (H) 0.0 - 0.2 mg/dL  Urine rapid drug screen (hosp performed)   Collection Time: 06/20/19 11:50 AM  Result Value Ref Range   Opiates NONE DETECTED NONE DETECTED   Cocaine NONE DETECTED NONE DETECTED   Benzodiazepines NONE DETECTED NONE DETECTED   Amphetamines NONE DETECTED NONE DETECTED   Tetrahydrocannabinol POSITIVE (A) NONE DETECTED   Barbiturates NONE DETECTED NONE DETECTED  Urinalysis, Routine w reflex microscopic   Collection Time: 06/20/19 11:50 AM  Result Value Ref Range   Color, Urine YELLOW YELLOW   APPearance CLOUDY (A) CLEAR   Specific Gravity, Urine 1.011 1.005 - 1.030   pH  7.0 5.0 - 8.0   Glucose, UA NEGATIVE NEGATIVE mg/dL   Hgb urine dipstick NEGATIVE NEGATIVE   Bilirubin Urine NEGATIVE NEGATIVE   Ketones, ur 80 (A) NEGATIVE mg/dL   Protein, ur NEGATIVE NEGATIVE mg/dL   Nitrite NEGATIVE NEGATIVE   Leukocytes,Ua NEGATIVE NEGATIVE  Comprehensive metabolic panel   Collection Time: 06/21/19  5:26 AM  Result Value Ref Range   Sodium 132 (L) 135 - 145 mmol/L   Potassium 4.1 3.5 - 5.1 mmol/L   Chloride 106 98 - 111 mmol/L   CO2 15 (L) 22 - 32 mmol/L   Glucose, Bld 82 70 - 99 mg/dL   BUN <5 (L) 6 - 20 mg/dL   Creatinine, Ser 0.96 (L) 0.44 -  1.00 mg/dL   Calcium 8.2 (L) 8.9 - 10.3 mg/dL   Total Protein 4.9 (L) 6.5 - 8.1 g/dL   Albumin 3.0 (L) 3.5 - 5.0 g/dL   AST 29 15 - 41 U/L   ALT 14 0 - 44 U/L   Alkaline Phosphatase 38 38 - 126 U/L   Total Bilirubin 3.6 (H) 0.3 - 1.2 mg/dL   GFR calc non Af Amer >60 >60 mL/min   GFR calc Af Amer >60 >60 mL/min   Anion gap 11 5 - 15  Lipase, blood   Collection Time: 06/21/19  5:26 AM  Result Value Ref Range   Lipase 19 11 - 51 U/L  Magnesium   Collection Time: 06/21/19  5:26 AM  Result Value Ref Range   Magnesium 1.9 1.7 - 2.4 mg/dL  Bilirubin, direct   Collection Time: 06/21/19  5:26 AM  Result Value Ref Range   Bilirubin, Direct 0.6 (H) 0.0 - 0.2 mg/dL  CBC with Differential   Collection Time: 06/21/19  5:26 AM  Result Value Ref Range   WBC 12.1 (H) 4.0 - 10.5 K/uL   RBC 3.91 3.87 - 5.11 MIL/uL   Hemoglobin 12.2 12.0 - 15.0 g/dL   HCT 09.835.0 (L) 11.936.0 - 14.746.0 %   MCV 89.5 80.0 - 100.0 fL   MCH 31.2 26.0 - 34.0 pg   MCHC 34.9 30.0 - 36.0 g/dL   RDW 82.913.4 56.211.5 - 13.015.5 %   Platelets 183 150 - 400 K/uL   nRBC 0.0 0.0 - 0.2 %   Neutrophils Relative % 79 %   Neutro Abs 9.5 (H) 1.7 - 7.7 K/uL   Lymphocytes Relative 16 %   Lymphs Abs 2.0 0.7 - 4.0 K/uL   Monocytes Relative 4 %   Monocytes Absolute 0.5 0.1 - 1.0 K/uL   Eosinophils Relative 0 %   Eosinophils Absolute 0.0 0.0 - 0.5 K/uL   Basophils Relative 0  %   Basophils Absolute 0.0 0.0 - 0.1 K/uL   Immature Granulocytes 1 %   Abs Immature Granulocytes 0.07 0.00 - 0.07 K/uL  Comprehensive metabolic panel   Collection Time: 06/22/19  5:39 AM  Result Value Ref Range   Sodium 135 135 - 145 mmol/L   Potassium 3.4 (L) 3.5 - 5.1 mmol/L   Chloride 105 98 - 111 mmol/L   CO2 20 (L) 22 - 32 mmol/L   Glucose, Bld 111 (H) 70 - 99 mg/dL   BUN <5 (L) 6 - 20 mg/dL   Creatinine, Ser 8.650.35 (L) 0.44 - 1.00 mg/dL   Calcium 8.3 (L) 8.9 - 10.3 mg/dL   Total Protein 4.9 (L) 6.5 - 8.1 g/dL   Albumin 2.8 (L) 3.5 - 5.0 g/dL   AST 11 (L) 15 - 41 U/L   ALT 15 0 - 44 U/L   Alkaline Phosphatase 35 (L) 38 - 126 U/L   Total Bilirubin 1.8 (H) 0.3 - 1.2 mg/dL   GFR calc non Af Amer >60 >60 mL/min   GFR calc Af Amer >60 >60 mL/min   Anion gap 10 5 - 15  Magnesium   Collection Time: 06/22/19  5:39 AM  Result Value Ref Range   Magnesium 1.6 (L) 1.7 - 2.4 mg/dL  Glucose, capillary   Collection Time: 06/22/19  5:56 AM  Result Value Ref Range   Glucose-Capillary 86 70 - 99 mg/dL  Results for orders placed or performed in visit on 06/19/19 (from the past 168 hour(s))  GC/Chlamydia probe amp ([place])not  at [facility]   Collection Time: 06/19/19  1:46 PM  Result Value Ref Range   Neisseria Gonorrhea Negative    Chlamydia Positive (A)    Comment Normal Reference Ranger Chlamydia - Negative    Comment      Normal Reference Range Neisseria Gonorrhea - Negative  Culture, OB Urine   Collection Time: 06/19/19  3:44 PM   Specimen: Urine   UR  Result Value Ref Range   Urine Culture, OB Final report   Urine Culture, OB Reflex   Collection Time: 06/19/19  3:44 PM  Result Value Ref Range   Organism ID, Bacteria Comment   Obstetric Panel, Including HIV   Collection Time: 06/19/19  3:53 PM  Result Value Ref Range   Hepatitis B Surface Ag Negative Negative   RPR Ser Ql Non Reactive Non Reactive   Rubella Antibodies, IGG 6.36 Immune >0.99 index   ABO Grouping O    Rh  Factor Negative    Antibody Screen Negative Negative   HIV Screen 4th Generation wRfx Non Reactive Non Reactive   WBC 16.7 (H) 3.4 - 10.8 x10E3/uL   RBC 5.04 3.77 - 5.28 x10E6/uL   Hemoglobin 15.5 11.1 - 15.9 g/dL   Hematocrit 40.9 81.1 - 46.6 %   MCV 87 79 - 97 fL   MCH 30.8 26.6 - 33.0 pg   MCHC 35.2 31.5 - 35.7 g/dL   RDW 91.4 78.2 - 95.6 %   Platelets 312 150 - 450 x10E3/uL   Neutrophils 72 Not Estab. %   Lymphs 21 Not Estab. %   Monocytes 6 Not Estab. %   Eos 0 Not Estab. %   Basos 0 Not Estab. %   Neutrophils Absolute 11.9 (H) 1.4 - 7.0 x10E3/uL   Lymphocytes Absolute 3.5 (H) 0.7 - 3.1 x10E3/uL   Monocytes Absolute 1.1 (H) 0.1 - 0.9 x10E3/uL   EOS (ABSOLUTE) 0.0 0.0 - 0.4 x10E3/uL   Basophils Absolute 0.0 0.0 - 0.2 x10E3/uL   Immature Granulocytes 1 Not Estab. %   Immature Grans (Abs) 0.2 (H) 0.0 - 0.1 x10E3/uL   [place] Mfm Fetal Nuchal Translucency  Result Date: 05/30/2019 ----------------------------------------------------------------------  OBSTETRICS REPORT                       (Signed Final 05/30/2019 04:51 pm) ---------------------------------------------------------------------- Patient Info  ID #:       [id number]                          D.O.B.:  2001-02-21 (18 yrs)  Name:       Phineas Inches                   Visit Date: 05/30/2019 04:02 pm ---------------------------------------------------------------------- Performed By  Performed By:     Emeline Darling BS,      Ref. Address:     [address]  [place], [place]                                                             [address]  Attending:        Noralee Space MD        [facility]  Referred By:      Tereso Newcomer MD  ---------------------------------------------------------------------- Orders   #  Description                          Code         Ordered By   1  [place] MFM FETAL NUCHAL                  [phone]      Mahek Schlesinger      TRANSLUCENCY  ----------------------------------------------------------------------   #  Order #                    Accession #                 Episode #   1  [id number]                  [id number]                  [id number]  ---------------------------------------------------------------------- Indications   [redacted] weeks gestation of pregnancy                Z3A.12   Hyperemesis gravidarum with metabolic          O21.1   disturbance  ---------------------------------------------------------------------- Fetal Evaluation  Num Of Fetuses:         1  Fetal Heart Rate(bpm):  173  Cardiac Activity:       Observed  Presentation:           Variable  Amniotic Fluid  AFI FV:      Within normal limits ---------------------------------------------------------------------- Biometry  CRL:      62.4  mm     G. Age:  12w 3d                  EDD:   12/09/19 ---------------------------------------------------------------------- OB History  Gravidity:    1 ---------------------------------------------------------------------- Gestational Age  Clinical EDD:  12w 3d                                        EDD:   12/09/19  Best:          12w 3d     Det. By:  Clinical EDD  EDD:   12/09/19 ---------------------------------------------------------------------- 1st Trimester Genetic Sonogram Screening  Nuc Trans:       2.7  mm ---------------------------------------------------------------------- Impression  Ms. Strick, G1 P0, was admitted last week with hyperemesis  gravidarum and was later discharged.  On ultrasound  performed last week, increase to nuchal translucency was  suspected.  On today's ultrasound, the CRL measurement is consistent  with her previously-established dates and good fetal heart  activity is  seen. The nuchal translucency (NT) measures 2.7  millimeters, which is normal.  Fetal anatomy that could be  ascertained at this gestational age is normal.  I discussed the significance and limitations of first trimester  screening.  Patient reported she is overwhelmed with multiple  needle punctures last week and would like to forego first  trimester screening.  She will be deciding on her OB provider  and make an appointment.  I informed her that she still has 10 days to consider giving  blood sample for first chemistry screening. ---------------------------------------------------------------------- Recommendations  -Noninvasive prenatal screening to be discussed.  -Fetal anatomy scan at 18 to 20 weeks. ----------------------------------------------------------------------                  Tama High, MD Electronically Signed Final Report   05/30/2019 04:51 pm ----------------------------------------------------------------------  [place] Mfm Ob Limited  Result Date: 05/24/2019 ----------------------------------------------------------------------  OBSTETRICS REPORT                       (Signed Final 05/24/2019 10:22 am) ---------------------------------------------------------------------- Patient Info  ID #:       [id number]                          D.O.B.:  11/27/2000 (18 yrs)  Name:       Vira Blanco                   Visit Date: 05/24/2019 09:12 am ---------------------------------------------------------------------- Performed By  Performed By:     Hubert Azure          Ref. Address:     [address]                    [place], [place]  Attending:        Tama High MD        [facility]  Referred By:      Tereso Newcomer MD ---------------------------------------------------------------------- Orders   #  Description                          Code         Ordered By   1  [place] MFM OB LIMITED                    [id number]     Jaynie Collins  ----------------------------------------------------------------------   #  Order #                    Accession #                 Episode #   1  [id number]                  [id number]                  [id number]  ---------------------------------------------------------------------- Indications   Hyperemesis gravidarum with metabolic          O21.1   disturbance   [redacted] weeks gestation of pregnancy                Z3A.11  ---------------------------------------------------------------------- Fetal Evaluation  Num Of Fetuses:         1  Fetal Heart Rate(bpm):  180  Cardiac Activity:       Observed  Presentation:           Variable  Placenta:               Anterior  Amniotic Fluid  AFI FV:      Within normal limits ---------------------------------------------------------------------- Biometry  CRL:     51.27  mm     G. Age:  11w 5d                  EDD:   12/08/19 ---------------------------------------------------------------------- OB History  Gravidity:    1 ---------------------------------------------------------------------- Gestational Age  Clinical EDD:  11w 4d                                        EDD:   12/09/19  Best:          11w 4d     Det. By:  Clinical EDD             EDD:   12/09/19 ---------------------------------------------------------------------- Cervix Uterus Adnexa  Cervix  Normal appearance by transabdominal scan.  Uterus  No abnormality visualized.  Left Ovary  Within normal limits.  Right Ovary  Not visualized.  Adnexa  No abnormality visualized. ---------------------------------------------------------------------- Impression  Ms. Borenstein, G1 P0, is admitted with hyperemesis gravidarum.  A limited ultrasound study was performed. The CRL  measurement is  consistent with her previously-established  dates and good fetal heart activity is seen. The nuchal-  translucency measures 3 millimeters (increased). However,  full evaluation could not be made because of fetal position. ---------------------------------------------------------------------- Recommendations  -First-trimester anatomy including nuchal-translucency  measurement next week.  -Offer cell-free fetal DNA screening. ----------------------------------------------------------------------                  Noralee Space, MD Electronically Signed Final Report  05/24/2019 10:22 am ----------------------------------------------------------------------  US Abdomen Limited Ruq  Result Date: 06/20/2019 CLINICAL DATA:  Nausea and vomiting EXAM: ULTRASOUND ABDOMEN LIMITED RIGHT UPPER QUADRANT COMPARISON:  05/23/2019 FINDINGS: Gallbladder: Hypoechoic intraluminal gallbladder sludge noted. no definite gallstones or Murphy's sign elicited. No pericholecystic fluid. Mild wall thickening measuring up to 3-5 mm anteriorly. Common bile duct: Diameter: 5.3 mm Liver: Normal echogenicity. No focal abnormality. Mild intrahepatic biliary prominence as well throughout the liver. Portal vein is patent on color Doppler imaging with normal direction of blood flow towards the liver. Other: No free fluid or ascites IMPRESSION: Similar gallbladder sludge without definite signs of acute cholecystitis. No gallstones visualized. Mild intrahepatic biliary prominence remains nonspecific. Correlate with LFTs. If there are laboratory abnormalities, consider follow-up MRI/MRCP. No focal hepatic abnormality. Electronically Signed   By: Judie Petit.  Shick M.D.   On: 06/20/2019 10:20   US Abdomen Limited Ruq  Result Date: 05/23/2019 CLINICAL DATA:  Eleven weeks pregnant nausea and vomiting EXAM: ULTRASOUND ABDOMEN LIMITED RIGHT UPPER QUADRANT COMPARISON:  None. FINDINGS: Gallbladder: Filled with sludge. No shadowing stone. Normal wall thickness.  Negative sonographic Murphy Common bile duct: Diameter: 5.4 mm Liver: No focal lesion identified. Within normal limits in parenchymal echogenicity. Portal vein is patent on color Doppler imaging with normal direction of blood flow towards the liver. Other: None. IMPRESSION: 1. Gallbladder sludge without acute inflammatory change by sonography. No biliary dilatation 2. Ultrasound appearance of the liver is within normal limits Electronically Signed   By: Jasmine Pang M.D.   On: 05/23/2019 21:52    Future Appointments  Date Time Provider Department Center  07/11/2019  2:15 PM Madlyn Frankel [facility]  07/17/2019 10:30 AM [facility] [place] 1 [facility]-M[facility]  07/17/2019 10:40 AM [facility] NURSE [facility]-US    Discharge Condition: Stable  Discharge disposition: 06-Home-Health Care Svc      Allergies as of 06/22/2019      Reactions   Lactose Intolerance (gi)       Medication List    TAKE these medications   acetaminophen 325 MG tablet Commonly known as: TYLENOL Take 2 tablets (650 mg total) by mouth every 4 (four) hours as needed (for pain scale < 4  OR  temperature  >/=  100.5 F).   glycopyrrolate 2 MG tablet Commonly known as: ROBINUL Take 1 tablet (2 mg total) by mouth 3 (three) times daily as needed.   methylPREDNISolone 16 MG tablet Commonly known as: MEDROL Take 1 tablet (16 mg total) by mouth 3 (three) times daily with meals for 3 days. What changed:   medication strength  how much to take  when to take this  Another medication with the same name was removed. Continue taking this medication, and follow the directions you see here.   methylPREDNISolone 8 MG tablet Commonly known as: MEDROL Take 1 tablet (8 mg total) by mouth 3 (three) times daily with meals for 7 days. What changed:   medication strength  how much to take  when to take this  Another medication with the same name was removed. Continue taking this medication, and follow the  directions you see here.   methylPREDNISolone 4 MG tablet Commonly known as: MEDROL Take 1 tablet (4 mg total) by mouth 3 (three) times daily with meals for 4 days. What changed:   when to take this  Another medication with the same name was removed. Continue taking this medication, and follow the directions you see here.   metoCLOPramide 10 MG tablet Commonly  known as: REGLAN Take 1 tablet (10 mg total) by mouth every 6 (six) hours.   ondansetron 4 MG disintegrating tablet Commonly known as: ZOFRAN-ODT Take 1 tablet (4 mg total) by mouth every 8 (eight) hours.   pantoprazole 40 MG tablet Commonly known as: PROTONIX Take 1 tablet (40 mg total) by mouth daily.   prochlorperazine 10 MG tablet Commonly known as: COMPAZINE Take 1 tablet (10 mg total) by mouth every 6 (six) hours as needed for nausea or vomiting.   promethazine 25 MG tablet Commonly known as: PHENERGAN Take 1 tablet (25 mg total) by mouth every 6 (six) hours as needed for nausea or vomiting. What changed: Another medication with the same name was added. Make sure you understand how and when to take each.   promethazine 25 MG suppository Commonly known as: Phenergan Place 1 suppository (25 mg total) rectally every 6 (six) hours as needed for nausea or vomiting. What changed: You were already taking a medication with the same name, and this prescription was added. Make sure you understand how and when to take each.   pyridOXINE 25 MG tablet Commonly known as: VITAMIN B-6 Take 1 tablet (25 mg total) by mouth every 8 (eight) hours.   scopolamine 1 MG/3DAYS Commonly known as: TRANSDERM-SCOP Place 1 patch (1.5 mg total) onto the skin every 3 (three) days for 21 days.        Signed: Jaynie Collins M.D. 06/22/2019, 11:02 AM

## 2019-06-22 NOTE — Clinical SW OB High Risk (Signed)
Clinical Social Work Antenatal   Clinical Social Worker:  Dimple Nanas, LCSW Date/Time:  06/22/2019, 11:17 AM Gestational Age on Admission:  18 y.o. Admitting Diagnosis: Hyperemesis   Expected Delivery Date:  12/02/19  Family/Home Environment  Home Address:  [address] [place] [address]  Household Member/Support Name:  MOB reported that MOB is currently residing with her mom.  MOB is married and husband is in the [place] stations in [place], [place]. Relationship:    Other Support:  MOB has the support of FOB/Husband, MOB's mother, MOB's grandmother, and FOB's family.    Psychosocial Data  Information Source:  Patient Interview Resources:  CSW provided MOB with information to apply for [facility].    Employment:  unemployed   Medicaid [facility]):   School:     Current Grade:  MOB completed high school Homebound Arranged: No  Other Resources:  Therapist, art provided MOB with information to apply for [facility].)  Cultural/Environment Issues Impacting Care:  None reported   Strengths/Weaknesses/Factors to Consider  Concerns Related to Hospitalization:     Previous Pregnancies/Feelings Towards Pregnancy?  Concerns related to being/becoming a mother?: none reported  Social Support (FOB? Who is/will be helping with baby/other kids?): Per MOB, MOB's pregnancy was not planned however, FOB/husband has been very supportive.  Couples Relationship (describe): Loving relationship per MOB.    Recent Stressful Life Events (life changes in past year?):    Prenatal Care/Education/Home Preparations:    Domestic Violence (of any type):  No If Yes to Domestic Violence, Describe/Action Plan:    Substance Use During Pregnancy: Yes(Per MOB, MOB has been smoking marijuana to decrease her nausea and increase her appetite.) (If Yes, Complete SBIRT)  Complete PHQ-9 (Depresssion Screening) on all Antenatal Patients PHQ-9 Score (If Score => 15 complete TREAT):      Follow-up Recommendations:  CSW plans to follow plan for prenatal care with CSW Elam.   Patient Advised/Response:     Other:      Clinical Assessment/Plan:   CSW met with MOB in room 103 to complete an assessment for teen pregnancy and hx of THC use during pregnancy.  When CSW arrived, MOB appeared tearful.  CSW inquired about MOB's thoughts and feelings and MOB openly shared feelings of being nervous about having aPICC line and Home Health.  CSW processed MOB's thoughts and feeling and MOB was able to communicate her fears.  CSW assured MOB that someone from Case Management will come speak with MOB regarding the process and procedures for home health. It's was also reassuring to MOB that MOB's mother will home to support MOB. CSW explained CSW's role and MOB was receptive to meeting with CSW, easy to engage, and forthcoming.   CSW assessed for supports and needed resources.  MOB shared that MOB has a great support team that consists of MOB's and FOB's immediate family members. MOB denied needing any supports but was interested in resources that CSW considered MOB to eligible for; like WIC; resource information was provided to MOB and MOB agreed to follow-up after discharge.   CSW asked about MOB's substance use and MOB openly shared her use of marijuana during pregnancy.  MOB reported "My mom shared with me that she stayed sick while pregnant with me and the only thing that helped her was smoking weed."  CSW reported that she has engaged in smoking marijuana to reduce her nausea and increase her appetite and per MOB "It worked."  MOB communicated that she stopped smoking after being informed of  the hospital's infant drug exposure policy.  CSW reviewed the policy and spoke with MOB at length about CPS involvement after giving birth if infant has a positive toxicology screen. MOB was understanding and denied having an addiction and need for outpatient counseling.   MOB is aware that CSW will  visit with MOB again if MOB delivers at this hospital to complete maternal clinicial assessment.   Laurey Arrow, MSW, LCSW Clinical Social Work [phone]

## 2019-06-22 NOTE — Progress Notes (Signed)
PHARMACY CONSULT NOTE FOR:  OUTPATIENT  PARENTERAL ANTIBIOTIC THERAPY (OPAT)  Indication: Hyperemesis Regimen: Needs IV fluids. 0.9% sodium chloride infusion. Total 1L. Rate 500 ml/hr, so 2 hours duration daily.  End date: 07/21/19  IV antibiotic discharge orders are pended. To discharging provider:  please sign these orders via discharge navigator,  Select New Orders & click on the button choice - Manage This Unsigned Work.     Thank you for allowing pharmacy to be a part of this patient's care.  Hovey-Rankin, Geneva Barrero 06/22/2019, 12:35 PM

## 2019-06-22 NOTE — Plan of Care (Signed)
  Problem: Clinical Measurements: Goal: Diagnostic test results will improve Outcome: Completed/Met Goal: Cardiovascular complication will be avoided Outcome: Completed/Met   Problem: Coping: Goal: Level of anxiety will decrease Outcome: Completed/Met   Problem: Elimination: Goal: Will not experience complications related to bowel motility Outcome: Completed/Met Goal: Will not experience complications related to urinary retention Outcome: Completed/Met   Problem: Pain Managment: Goal: General experience of comfort will improve Outcome: Completed/Met   Problem: Safety: Goal: Ability to remain free from injury will improve Outcome: Completed/Met   Problem: Skin Integrity: Goal: Risk for impaired skin integrity will decrease Outcome: Completed/Met   Problem: Education: Goal: Knowledge of disease or condition will improve Outcome: Completed/Met   Problem: Education: Goal: Knowledge of disease or condition will improve Outcome: Completed/Met   Problem: Bowel/Gastric: Goal: Occurences of nausea and/or vomiting will decrease Outcome: Completed/Met

## 2019-06-27 ENCOUNTER — Telehealth: Payer: Self-pay | Admitting: Lactation Services

## 2019-06-27 NOTE — Telephone Encounter (Signed)
Called [facility] in response to their request for missing information of the date  that [facility] Panel was drawn.   Johnsie Cancel was unable to accept call and message was sent to voicemail. LM for information that was needed. Will also fax report to [facility].

## 2019-06-28 ENCOUNTER — Encounter: Payer: Self-pay | Admitting: General Practice

## 2019-07-03 ENCOUNTER — Encounter: Payer: Self-pay | Admitting: *Deleted

## 2019-07-04 ENCOUNTER — Encounter: Payer: Self-pay | Admitting: *Deleted

## 2019-07-11 ENCOUNTER — Other Ambulatory Visit: Payer: Self-pay

## 2019-07-11 ENCOUNTER — Ambulatory Visit (INDEPENDENT_AMBULATORY_CARE_PROVIDER_SITE_OTHER): Admitting: Student

## 2019-07-11 DIAGNOSIS — Z6791 Unspecified blood type, Rh negative: Secondary | ICD-10-CM | POA: Insufficient documentation

## 2019-07-11 DIAGNOSIS — O0992 Supervision of high risk pregnancy, unspecified, second trimester: Secondary | ICD-10-CM

## 2019-07-11 DIAGNOSIS — O26892 Other specified pregnancy related conditions, second trimester: Secondary | ICD-10-CM

## 2019-07-11 DIAGNOSIS — Z3A18 18 weeks gestation of pregnancy: Secondary | ICD-10-CM

## 2019-07-11 DIAGNOSIS — O0990 Supervision of high risk pregnancy, unspecified, unspecified trimester: Secondary | ICD-10-CM

## 2019-07-11 DIAGNOSIS — O26899 Other specified pregnancy related conditions, unspecified trimester: Secondary | ICD-10-CM

## 2019-07-11 NOTE — Progress Notes (Signed)
Patient ID: Jordan Murray, female   DOB: [birth year], 18 y.o.   MRN: [id number]   PRENATAL VISIT NOTE  Subjective:  Jordan Murray is a 18 y.o. G1P0 at [redacted]w[redacted]d being seen today for ongoing prenatal care.  She is currently monitored for the following issues for this high-risk pregnancy and has Hypokalemia; Dehydration; Hyperemesis gravidarum with metabolic disturbance, antepartum; Supervision of high risk pregnancy, antepartum; Marijuana abuse; Cannabinoid hyperemesis syndrome; Gilbert syndrome; Chlamydia infection affecting pregnancy; and Rh negative state in antepartum period on their problem list.  Patient reports no complaints.  Contractions: Not present. Vag. Bleeding: None.  Movement: Present. Denies leaking of fluid.   The following portions of the patient's history were reviewed and updated as appropriate: allergies, current medications, past family history, past medical history, past social history, past surgical history and problem list.   Objective:   Vitals:   07/11/19 1430  BP: (!) 115/59  Pulse: 70  Temp: 97.9 F (36.6 C)  Weight: 151 lb 1.6 oz (68.5 kg)    Fetal Status: Fetal Heart Rate (bpm): 157   Movement: Present     General:  Alert, oriented and cooperative. Patient is in no acute distress.  Skin: Skin is warm and dry. No rash noted.   Cardiovascular: Normal heart rate noted  Respiratory: Normal respiratory effort, no problems with respiration noted  Abdomen: Soft, gravid, appropriate for gestational age.  Pain/Pressure: Absent     Pelvic: Cervical exam deferred        Extremities: Normal range of motion.  Edema: None  Mental Status: Normal mood and affect. Normal behavior. Normal judgment and thought content.   Assessment and Plan:  Pregnancy: G1P0 at [redacted]w[redacted]d 1. Supervision of high risk pregnancy, antepartum -patient feels much better! -continue on current antiemetic regimen - AFP, Serum, Open Spina Bifida -will do TOC for chlamydia next week at nurse visit.  -will  be getting blood pressure cuff this weekend and will start taking blood pressure.    Preterm labor symptoms and general obstetric precautions including but not limited to vaginal bleeding, contractions, leaking of fluid and fetal movement were reviewed in detail with the patient. Please refer to After Visit Summary for other counseling recommendations.   Return keep lab visit for swabs on 12-14 and also needs LROB on MyChart in 4 weeks..  Future Appointments  Date Time Provider [place]  07/17/2019 10:30 AM [place] 1 WH-[facility]US MFC-US  07/17/2019 10:40 AM [place] NURSE [place] [facility]-US    Starr Lake, CNM

## 2019-07-11 NOTE — Progress Notes (Signed)
Pt states she no longer has N&V and is able to eat well. Pt also reports having a bruise under Lt breast 1 weeks ago which was painful - the bruise is gone now however still having some tenderness.

## 2019-07-13 LAB — AFP, SERUM, OPEN SPINA BIFIDA
AFP MoM: 1.07
AFP Value: 49 ng/mL
Gest. Age on Collection Date: 18.4 weeks
Maternal Age At EDD: 19.7 yr
OSBR Risk 1 IN: 9862
Test Results:: NEGATIVE
Weight: 151 [lb_av]

## 2019-07-17 ENCOUNTER — Other Ambulatory Visit: Payer: Self-pay

## 2019-07-17 ENCOUNTER — Other Ambulatory Visit: Payer: Self-pay | Admitting: Family Medicine

## 2019-07-17 ENCOUNTER — Other Ambulatory Visit (HOSPITAL_COMMUNITY): Payer: Self-pay | Admitting: *Deleted

## 2019-07-17 ENCOUNTER — Ambulatory Visit (HOSPITAL_COMMUNITY): Admitting: *Deleted

## 2019-07-17 ENCOUNTER — Encounter (HOSPITAL_COMMUNITY): Payer: Self-pay

## 2019-07-17 ENCOUNTER — Ambulatory Visit (HOSPITAL_COMMUNITY)
Admission: RE | Admit: 2019-07-17 | Discharge: 2019-07-17 | Disposition: A | Discharge status: Home / Self Care | Source: Ambulatory Visit | Attending: Obstetrics and Gynecology | Admitting: Obstetrics and Gynecology

## 2019-07-17 VITALS — BP 104/65 | HR 82 | Temp 97.7°F

## 2019-07-17 DIAGNOSIS — O358XX0 Maternal care for other (suspected) fetal abnormality and damage, not applicable or unspecified: Secondary | ICD-10-CM | POA: Diagnosis present

## 2019-07-17 DIAGNOSIS — O0990 Supervision of high risk pregnancy, unspecified, unspecified trimester: Secondary | ICD-10-CM | POA: Diagnosis not present

## 2019-07-17 DIAGNOSIS — O2692 Pregnancy related conditions, unspecified, second trimester: Secondary | ICD-10-CM | POA: Diagnosis not present

## 2019-07-17 DIAGNOSIS — O35BXX0 Maternal care for other (suspected) fetal abnormality and damage, fetal cardiac anomalies, not applicable or unspecified: Secondary | ICD-10-CM

## 2019-07-17 DIAGNOSIS — Z3A19 19 weeks gestation of pregnancy: Secondary | ICD-10-CM | POA: Diagnosis not present

## 2019-07-17 DIAGNOSIS — Q249 Congenital malformation of heart, unspecified: Secondary | ICD-10-CM

## 2019-07-17 DIAGNOSIS — O359XX0 Maternal care for (suspected) fetal abnormality and damage, unspecified, not applicable or unspecified: Secondary | ICD-10-CM

## 2019-07-24 ENCOUNTER — Other Ambulatory Visit: Payer: Self-pay

## 2019-07-24 ENCOUNTER — Encounter (HOSPITAL_COMMUNITY): Payer: Self-pay | Admitting: Obstetrics & Gynecology

## 2019-07-24 ENCOUNTER — Inpatient Hospital Stay (HOSPITAL_COMMUNITY)
Admission: AD | Admit: 2019-07-24 | Discharge: 2019-07-24 | Disposition: A | Discharge status: Home / Self Care | Attending: Obstetrics & Gynecology | Admitting: Obstetrics & Gynecology

## 2019-07-24 DIAGNOSIS — O211 Hyperemesis gravidarum with metabolic disturbance: Secondary | ICD-10-CM | POA: Diagnosis not present

## 2019-07-24 DIAGNOSIS — E876 Hypokalemia: Secondary | ICD-10-CM

## 2019-07-24 DIAGNOSIS — O10912 Unspecified pre-existing hypertension complicating pregnancy, second trimester: Secondary | ICD-10-CM | POA: Diagnosis present

## 2019-07-24 DIAGNOSIS — E739 Lactose intolerance, unspecified: Secondary | ICD-10-CM | POA: Insufficient documentation

## 2019-07-24 DIAGNOSIS — Z79899 Other long term (current) drug therapy: Secondary | ICD-10-CM | POA: Diagnosis not present

## 2019-07-24 DIAGNOSIS — Z3A20 20 weeks gestation of pregnancy: Secondary | ICD-10-CM | POA: Diagnosis not present

## 2019-07-24 DIAGNOSIS — O162 Unspecified maternal hypertension, second trimester: Secondary | ICD-10-CM | POA: Insufficient documentation

## 2019-07-24 LAB — COMPREHENSIVE METABOLIC PANEL
ALT: 66 U/L — ABNORMAL HIGH (ref 0–44)
AST: 32 U/L (ref 15–41)
Albumin: 3.8 g/dL (ref 3.5–5.0)
Alkaline Phosphatase: 61 U/L (ref 38–126)
Anion gap: 12 (ref 5–15)
BUN: 5 mg/dL — ABNORMAL LOW (ref 6–20)
CO2: 28 mmol/L (ref 22–32)
Calcium: 9.1 mg/dL (ref 8.9–10.3)
Chloride: 91 mmol/L — ABNORMAL LOW (ref 98–111)
Creatinine, Ser: 0.57 mg/dL (ref 0.44–1.00)
GFR calc Af Amer: >60 mL/min (ref >60)
GFR calc non Af Amer: >60 mL/min (ref >60)
Glucose, Bld: 125 mg/dL — ABNORMAL HIGH (ref 70–99)
Potassium: 2.3 mmol/L — CL (ref 3.5–5.1)
Sodium: 131 mmol/L — ABNORMAL LOW (ref 135–145)
Total Bilirubin: 3.1 mg/dL — ABNORMAL HIGH (ref 0.3–1.2)
Total Protein: 6.7 g/dL (ref 6.5–8.1)

## 2019-07-24 LAB — URINALYSIS, ROUTINE W REFLEX MICROSCOPIC
Glucose, UA: NEGATIVE mg/dL
Hgb urine dipstick: NEGATIVE
Ketones, ur: 80 mg/dL — AB
Leukocytes,Ua: NEGATIVE
Nitrite: NEGATIVE
Protein, ur: 30 mg/dL — AB
Specific Gravity, Urine: 1.017 (ref 1.005–1.030)
pH: 6 (ref 5.0–8.0)

## 2019-07-24 LAB — CBC
HCT: 42.9 % (ref 36.0–46.0)
Hemoglobin: 15.8 g/dL — ABNORMAL HIGH (ref 12.0–15.0)
MCH: 31.3 pg (ref 26.0–34.0)
MCHC: 36.8 g/dL — ABNORMAL HIGH (ref 30.0–36.0)
MCV: 85.1 fL (ref 80.0–100.0)
Platelets: 260 10*3/uL (ref 150–400)
RBC: 5.04 MIL/uL (ref 3.87–5.11)
RDW: 12.4 % (ref 11.5–15.5)
WBC: 12.5 10*3/uL — ABNORMAL HIGH (ref 4.0–10.5)
nRBC: 0 % (ref 0.0–0.2)

## 2019-07-24 MED ORDER — METOCLOPRAMIDE HCL 5 MG/ML IJ SOLN
10.0000 mg | Freq: Once | INTRAMUSCULAR | Status: AC
  Administered 2019-07-24: 10 mg via INTRAVENOUS
  Filled 2019-07-24: qty 2

## 2019-07-24 MED ORDER — ONDANSETRON HCL 4 MG/2ML IJ SOLN
4.0000 mg | Freq: Once | INTRAMUSCULAR | Status: AC
  Administered 2019-07-24: 08:00:00 4 mg via INTRAVENOUS
  Filled 2019-07-24: qty 2

## 2019-07-24 MED ORDER — PROCHLORPERAZINE EDISYLATE 10 MG/2ML IJ SOLN
10.0000 mg | Freq: Once | INTRAMUSCULAR | Status: AC
  Administered 2019-07-24: 09:00:00 10 mg via INTRAVENOUS
  Filled 2019-07-24: qty 2

## 2019-07-24 MED ORDER — POTASSIUM CHLORIDE ER 10 MEQ PO CPCR: 10.0000 meq | ORAL_CAPSULE | Freq: Two times a day (BID) | ORAL | 0 refills | Status: AC

## 2019-07-24 MED ORDER — FAMOTIDINE IN NACL 20-0.9 MG/50ML-% IV SOLN
20.0000 mg | Freq: Once | INTRAVENOUS | Status: AC
  Administered 2019-07-24: 20 mg via INTRAVENOUS
  Filled 2019-07-24: qty 50

## 2019-07-24 MED ORDER — PROMETHAZINE HCL 25 MG/ML IJ SOLN
25.0000 mg | Freq: Once | INTRAMUSCULAR | Status: AC
  Administered 2019-07-24: 08:00:00 25 mg via INTRAVENOUS
  Filled 2019-07-24: qty 1

## 2019-07-24 MED ORDER — LACTATED RINGERS IV BOLUS
1000.0000 mL | Freq: Once | INTRAVENOUS | Status: AC
  Administered 2019-07-24: 08:00:00 1000 mL via INTRAVENOUS

## 2019-07-24 MED ORDER — THIAMINE HCL 100 MG/ML IJ SOLN
Freq: Once | INTRAVENOUS | Status: AC
  Filled 2019-07-24: qty 1000

## 2019-07-24 MED ORDER — ASPIRIN 81 MG PO CHEW: 81.0000 mg | CHEWABLE_TABLET | Freq: Every day | ORAL | 4 refills | Status: AC

## 2019-07-24 MED ORDER — GLYCOPYRROLATE 0.2 MG/ML IJ SOLN
0.1000 mg | Freq: Once | INTRAMUSCULAR | Status: AC
  Administered 2019-07-24: 08:00:00 0.1 mg via INTRAVENOUS
  Filled 2019-07-24: qty 1

## 2019-07-24 NOTE — MAU Note (Signed)
Pt reports to MAU for constant vomiting.  All this week she has not been able to keep much down. Denies VB and LOF, +movement.

## 2019-07-24 NOTE — Discharge Instructions (Signed)
Hypertension During Pregnancy High blood pressure (hypertension) is when the force of blood pumping through the arteries is too strong. Arteries are blood vessels that carry blood from the heart throughout the body. Hypertension during pregnancy can be mild or severe. Severe hypertension during pregnancy (preeclampsia) is a medical emergency that requires prompt evaluation and treatment. Different types of hypertension can happen during pregnancy. These include:  Chronic hypertension. This happens when you had high blood pressure before you became pregnant, and it continues during the pregnancy. Hypertension that develops before you are [redacted] weeks pregnant and continues during the pregnancy is also called chronic hypertension. If you have chronic hypertension, it will not go away after you have your baby. You will need follow-up visits with your health care provider after you have your baby. Your doctor may want you to keep taking medicine for your blood pressure.  Gestational hypertension. This is hypertension that develops after the 20th week of pregnancy. Gestational hypertension usually goes away after you have your baby, but your health care provider will need to monitor your blood pressure to make sure that it is getting better.  Preeclampsia. This is severe hypertension during pregnancy. This can cause serious complications for you and your baby and can also cause complications for you after the delivery of your baby.  Postpartum preeclampsia. You may develop severe hypertension after giving birth. This usually occurs within 48 hours after childbirth but may occur up to 6 weeks after giving birth. This is rare. How does this affect me? Women who have hypertension during pregnancy have a greater chance of developing hypertension later in life or during future pregnancies. In some cases, hypertension during pregnancy can cause serious complications, such as:  Stroke.  Heart attack.  Injury to  other organs, such as kidneys, lungs, or liver.  Preeclampsia.  Convulsions or seizures.  Placental abruption. How does this affect my baby? Hypertension during pregnancy can affect your baby. Your baby may:  Be born early (prematurely).  Not weigh as much as he or she should at birth (low birth weight).  Not tolerate labor well, leading to an unplanned cesarean delivery. What are the risks? There are certain factors that make it more likely for you to develop hypertension during pregnancy. These include:  Having hypertension during a previous pregnancy.  Being overweight.  Being age 35 or older.  Being pregnant for the first time.  Being pregnant with more than one baby.  Becoming pregnant using fertilization methods, such as IVF (in vitro fertilization).  Having other medical problems, such as diabetes, kidney disease, or lupus.  Having a family history of hypertension. What can I do to lower my risk? The exact cause of hypertension during pregnancy is not known. You may be able to lower your risk by:  Maintaining a healthy weight.  Eating a healthy and balanced diet.  Following your health care provider's instructions about treating any long-term conditions that you had before becoming pregnant. It is very important to keep all of your prenatal care appointments. Your health care provider will check your blood pressure and make sure that your pregnancy is progressing as expected. If a problem is found, early treatment can prevent complications. How is this treated? Treatment for hypertension during pregnancy varies depending on the type of hypertension you have and how serious it is.  If you were taking medicine for high blood pressure before you became pregnant, talk with your health care provider. You may need to change medicine during pregnancy because   some medicines, like ACE inhibitors, may not be considered safe for your baby.  If you have gestational  hypertension, your health care provider may order medicine to treat this during pregnancy.  If you are at risk for preeclampsia, your health care provider may recommend that you take a low-dose aspirin during your pregnancy.  If you have severe hypertension, you may need to be hospitalized so you and your baby can be monitored closely. You may also need to be given medicine to lower your blood pressure. This medicine may be given by mouth or through an IV.  In some cases, if your condition gets worse, you may need to deliver your baby early. Follow these instructions at home: Eating and drinking   Drink enough fluid to keep your urine pale yellow.  Avoid caffeine. Lifestyle  Do not use any products that contain nicotine or tobacco, such as cigarettes, e-cigarettes, and chewing tobacco. If you need help quitting, ask your health care provider.  Do not use alcohol or drugs.  Avoid stress as much as possible.  Rest and get plenty of sleep.  Regular exercise can help to reduce your blood pressure. Ask your health care provider what kinds of exercise are best for you. General instructions  Take over-the-counter and prescription medicines only as told by your health care provider.  Keep all prenatal and follow-up visits as told by your health care provider. This is important. Contact a health care provider if:  You have symptoms that your health care provider told you may require more treatment or monitoring, such as: ? Headaches. ? Nausea or vomiting. ? Abdominal pain. ? Dizziness. ? Light-headedness. Get help right away if:  You have: ? Severe abdominal pain that does not get better with treatment. ? A severe headache that does not get better. ? Vomiting that does not get better. ? Sudden, rapid weight gain. ? Sudden swelling in your hands, ankles, or face. ? Vaginal bleeding. ? Blood in your urine. ? Blurred or double vision. ? Shortness of breath or chest  pain. ? Weakness on one side of your body. ? Difficulty speaking.  Your baby is not moving as much as usual. Summary  High blood pressure (hypertension) is when the force of blood pumping through the arteries is too strong.  Hypertension during pregnancy can cause problems for you and your baby.  Treatment for hypertension during pregnancy varies depending on the type of hypertension you have and how serious it is.  Keep all prenatal and follow-up visits as told by your health care provider. This is important. This information is not intended to replace advice given to you by your health care provider. Make sure you discuss any questions you have with your health care provider. Document Released: 04/07/2011 Document Revised: 11/10/2018 Document Reviewed: 08/16/2018 Elsevier Patient Education  2020 [employer]  Hypokalemia Hypokalemia means that the amount of potassium in the blood is lower than normal. Potassium is a chemical (electrolyte) that helps regulate the amount of fluid in the body. It also stimulates muscle tightening (contraction) and helps nerves work properly. Normally, most of the body's potassium is inside cells, and only a very small amount is in the blood. Because the amount in the blood is so small, minor changes to potassium levels in the blood can be life-threatening. What are the causes? This condition may be caused by:  Antibiotic medicine.  Diarrhea or vomiting. Taking too much of a medicine that helps you have a bowel movement (laxative) can  cause diarrhea and lead to hypokalemia.  Chronic kidney disease (CKD).  Medicines that help the body get rid of excess fluid (diuretics).  Eating disorders, such as bulimia.  Low magnesium levels in the body.  Sweating a lot. What are the signs or symptoms? Symptoms of this condition include:  Weakness.  Constipation.  Fatigue.  Muscle cramps.  Mental confusion.  Skipped heartbeats or irregular  heartbeat (palpitations).  Tingling or numbness. How is this diagnosed? This condition is diagnosed with a blood test. How is this treated? This condition may be treated by:  Taking potassium supplements by mouth.  Adjusting the medicines that you take.  Eating more foods that contain a lot of potassium. If your potassium level is very low, you may need to get potassium through an IV and be monitored in the hospital. Follow these instructions at home:   Take over-the-counter and prescription medicines only as told by your health care provider. This includes vitamins and supplements.  Eat a healthy diet. A healthy diet includes fresh fruits and vegetables, whole grains, healthy fats, and lean proteins.  If instructed, eat more foods that contain a lot of potassium. This includes: ? Nuts, such as peanuts and pistachios. ? Seeds, such as sunflower seeds and pumpkin seeds. ? Peas, lentils, and lima beans. ? Whole grain and bran cereals and breads. ? Fresh fruits and vegetables, such as apricots, avocado, bananas, cantaloupe, kiwi, oranges, tomatoes, asparagus, and potatoes. ? Orange juice. ? Tomato juice. ? Red meats. ? Yogurt.  Keep all follow-up visits as told by your health care provider. This is important. Contact a health care provider if you:  Have weakness that gets worse.  Feel your heart pounding or racing.  Vomit.  Have diarrhea.  Have diabetes (diabetes mellitus) and you have trouble keeping your blood sugar (glucose) in your target range. Get help right away if you:  Have chest pain.  Have shortness of breath.  Have vomiting or diarrhea that lasts for more than 2 days.  Faint. Summary  Hypokalemia means that the amount of potassium in the blood is lower than normal.  This condition is diagnosed with a blood test.  Hypokalemia may be treated by taking potassium supplements, adjusting the medicines that you take, or eating more foods that are high in  potassium.  If your potassium level is very low, you may need to get potassium through an IV and be monitored in the hospital. This information is not intended to replace advice given to you by your health care provider. Make sure you discuss any questions you have with your health care provider. Document Released: 07/20/2005 Document Revised: 03/02/2018 Document Reviewed: 03/02/2018 Elsevier Patient Education  2020 [employer] Inc.  Hyperemesis Gravidarum Hyperemesis gravidarum is a severe form of nausea and vomiting that happens during pregnancy. Hyperemesis is worse than morning sickness. It may cause you to have nausea or vomiting all day for many days. It may keep you from eating and drinking enough food and liquids, which can lead to dehydration, malnutrition, and weight loss. Hyperemesis usually occurs during the first half (the first 20 weeks) of pregnancy. It often goes away once a woman is in her second half of pregnancy. However, sometimes hyperemesis continues through an entire pregnancy. What are the causes? The cause of this condition is not known. It may be related to changes in chemicals (hormones) in the body during pregnancy, such as the high level of pregnancy hormone (human chorionic gonadotropin) or the increase in the female  sex hormone (estrogen). What are the signs or symptoms? Symptoms of this condition include:  Nausea that does not go away.  Vomiting that does not allow you to keep any food down.  Weight loss.  Body fluid loss (dehydration).  Having no desire to eat, or not liking food that you have previously enjoyed. How is this diagnosed? This condition may be diagnosed based on:  A physical exam.  Your medical history.  Your symptoms.  Blood tests.  Urine tests. How is this treated? This condition is managed by controlling symptoms. This may include:  Following an eating plan. This can help lessen nausea and vomiting.  Taking prescription  medicines. An eating plan and medicines are often used together to help control symptoms. If medicines do not help relieve nausea and vomiting, you may need to receive fluids through an IV at the hospital. Follow these instructions at home: Eating and drinking   Avoid the following: ? Drinking fluids with meals. Try not to drink anything during the 30 minutes before and after your meals. ? Drinking more than 1 cup of fluid at a time. ? Eating foods that trigger your symptoms. These may include spicy foods, coffee, high-fat foods, very sweet foods, and acidic foods. ? Skipping meals. Nausea can be more intense on an empty stomach. If you cannot tolerate food, do not force it. Try sucking on ice chips or other frozen items and make up for missed calories later. ? Lying down within 2 hours after eating. ? Being exposed to environmental triggers. These may include food smells, smoky rooms, closed spaces, rooms with strong smells, warm or humid places, overly loud and noisy rooms, and rooms with motion or flickering lights. Try eating meals in a well-ventilated area that is free of strong smells. ? Quick and sudden changes in your movement. ? Taking iron pills and multivitamins that contain iron. If you take prescription iron pills, do not stop taking them unless your health care provider approves. ? Preparing food. The smell of food can spoil your appetite or trigger nausea.  To help relieve your symptoms: ? Listen to your body. Everyone is different and has different preferences. Find what works best for you. ? Eat and drink slowly. ? Eat 5-6 small meals daily instead of 3 large meals. Eating small meals and snacks can help you avoid an empty stomach. ? In the morning, before getting out of bed, eat a couple of crackers to avoid moving around on an empty stomach. ? Try eating starchy foods as these are usually tolerated well. Examples include cereal, toast, bread, potatoes, pasta, rice, and  pretzels. ? Include at least 1 serving of protein with your meals and snacks. Protein options include lean meats, poultry, seafood, beans, nuts, nut butters, eggs, cheese, and yogurt. ? Try eating a protein-rich snack before bed. Examples of a protein-rick snack include cheese and crackers or a peanut butter sandwich made with 1 slice of whole-wheat bread and 1 tsp (5 g) of peanut butter. ? Eat or suck on things that have ginger in them. It may help relieve nausea. Add  tsp ground ginger to hot tea or choose ginger tea. ? Try drinking 100% fruit juice or an electrolyte drink. An electrolyte drink contains sodium, potassium, and chloride. ? Drink fluids that are cold, clear, and carbonated or sour. Examples include lemonade, ginger ale, lemon-lime soda, ice water, and sparkling water. ? Brush your teeth or use a mouth rinse after meals. ? Talk with your health  care provider about starting a supplement of vitamin B6. General instructions  Take over-the-counter and prescription medicines only as told by your health care provider.  Follow instructions from your health care provider about eating or drinking restrictions.  Continue to take your prenatal vitamins as told by your health care provider. If you are having trouble taking your prenatal vitamins, talk with your health care provider about different options.  Keep all follow-up and pre-birth (prenatal) visits as told by your health care provider. This is important. Contact a health care provider if:  You have pain in your abdomen.  You have a severe headache.  You have vision problems.  You are losing weight.  You feel weak or dizzy. Get help right away if:  You cannot drink fluids without vomiting.  You vomit blood.  You have constant nausea and vomiting.  You are very weak.  You faint.  You have a fever and your symptoms suddenly get worse. Summary  Hyperemesis gravidarum is a severe form of nausea and vomiting that  happens during pregnancy.  Making some changes to your eating habits may help relieve nausea and vomiting.  This condition may be managed with medicine.  If medicines do not help relieve nausea and vomiting, you may need to receive fluids through an IV at the hospital. This information is not intended to replace advice given to you by your health care provider. Make sure you discuss any questions you have with your health care provider. Document Released: 07/20/2005 Document Revised: 08/09/2017 Document Reviewed: 03/18/2016 Elsevier Patient Education  2020 [employer].

## 2019-07-24 NOTE — [facility] Provider Note (Addendum)
History     [id number]  Arrival date and time: 07/24/19 4098    Chief Complaint  Patient presents with  . Nausea     HPI Jordan Murray is a 18 y.o. at [redacted]w[redacted]d by 11wk [place], history of hyperemesis gravidarum, who presents for n/v typical of her HG.   Admitted 06/19/2019 - 06/22/2019 for HG, hypokalemia. That was her third admission. At that time declined feeding tube but improved. D/c with glycoporrolate, methylprednisolone burst, reglan, zofran, pantoprazole, compazine, phenergan, and scopolamine.   Last seen in clinic 07/11/2019, at that time feeling well on antiemetic regimen  Today reports she had been doing very well on antiemetic regimen Told to try and taper down her meds by provider (not corroborated by note) and has subsequently felt very ill Frequent episodes of n/v  Denies headache, vision changes, chest pain, SOB, RUQ pain, LE edema  Vaginal bleeding: No LOF: No Fetal Movement: Yes Contractions: No  O/Negative/-- (11/16 1553)  OB History    Gravida  1   Para      Term      Preterm      AB      Living        SAB      TAB      Ectopic      Multiple      Live Births              Past Medical History:  Diagnosis Date  . Medical history non-contributory     Past Surgical History:  Procedure Laterality Date  . APPENDECTOMY      No family history on file.  Social History   Socioeconomic History  . Marital status: Married    Spouse name: Not on file  . Number of children: Not on file  . Years of education: Not on file  . Highest education level: Not on file  Occupational History  . Not on file  Tobacco Use  . Smoking status: Never Smoker  . Smokeless tobacco: Former Engineer, water and Sexual Activity  . Alcohol use: Not Currently  . Drug use: Not Currently  . Sexual activity: Not Currently    Birth control/protection: None  Other Topics Concern  . Not on file  Social History Narrative  . Not on file   Social  Determinants of Health   Financial Resource Strain:   . Difficulty of Paying Living Expenses: Not on file  Food Insecurity: No Food Insecurity  . Worried About Programme researcher, broadcasting/film/video in the Last Year: Never true  . Ran Out of Food in the Last Year: Never true  Transportation Needs: No Transportation Needs  . Lack of Transportation (Medical): No  . Lack of Transportation (Non-Medical): No  Physical Activity:   . Days of Exercise per Week: Not on file  . Minutes of Exercise per Session: Not on file  Stress:   . Feeling of Stress : Not on file  Social Connections:   . Frequency of Communication with Friends and Family: Not on file  . Frequency of Social Gatherings with Friends and Family: Not on file  . Attends Religious Services: Not on file  . Active Member of Clubs or Organizations: Not on file  . Attends Banker Meetings: Not on file  . Marital Status: Not on file  Intimate Partner Violence:   . Fear of Current or Ex-Partner: Not on file  . Emotionally Abused: Not on file  . Physically Abused: Not on  file  . Sexually Abused: Not on file    Allergies  Allergen Reactions  . Lactose Intolerance (Gi)     No current facility-administered medications on file prior to encounter.   Current Outpatient Medications on File Prior to Encounter  Medication Sig Dispense Refill  . Ondansetron HCl (ZOFRAN PO) Take by mouth.    . Pediatric Multiple Vit-C-FA (CHILDRENS CHEWABLE VITAMINS PO) Take 2 tablets by mouth daily.    . prochlorperazine (COMPAZINE) 10 MG tablet Take 1 tablet (10 mg total) by mouth every 6 (six) hours as needed for nausea or vomiting. 30 tablet 3  . promethazine (PHENERGAN) 25 MG tablet Take 1 tablet (25 mg total) by mouth every 6 (six) hours as needed for nausea or vomiting. 30 tablet 2  . acetaminophen (TYLENOL) 325 MG tablet Take 2 tablets (650 mg total) by mouth every 4 (four) hours as needed (for pain scale < 4  OR  temperature  >/=  100.5 F).    Marland Kitchen.  glycopyrrolate (ROBINUL) 2 MG tablet Take 1 tablet (2 mg total) by mouth 3 (three) times daily as needed. (Patient not taking: Reported on 07/11/2019) 30 tablet 3  . metoCLOPramide (REGLAN) 10 MG tablet Take 1 tablet (10 mg total) by mouth every 6 (six) hours. 60 tablet 1  . pantoprazole (PROTONIX) 40 MG tablet Take 1 tablet (40 mg total) by mouth daily. 30 tablet 3  . promethazine (PHENERGAN) 25 MG suppository Place 1 suppository (25 mg total) rectally every 6 (six) hours as needed for nausea or vomiting. (Patient not taking: Reported on 07/11/2019) 12 each 0     ROS Complete ROS completed and otherwise negative except as noted in HPI  Physical Exam   BP 133/79 (BP Location: Right Arm)   Pulse (!) 101   Temp 98.1 F (36.7 C)   Resp 16   Wt 61.8 kg   LMP  (LMP Unknown)   SpO2 97%   BMI 23.38 kg/m   Physical Exam  Vitals reviewed. Constitutional: She appears well-developed and well-nourished. No distress.  Eyes: No scleral icterus.  Respiratory: Effort normal. No respiratory distress.  GI: Soft. She exhibits no distension. There is no abdominal tenderness. There is no rebound and no guarding.  Musculoskeletal:        General: No edema.  Neurological: She is alert. Coordination normal.  Skin: Skin is warm and dry. She is not diaphoretic.  Psychiatric: She has a normal mood and affect.    Bedside Ultrasound Not performed  FHT Doppler 152  MAU Course  Procedures CBC CMP UPCR UDS Glycopyrrolate Reglan Zofran Compazine Phenergan Famotidine 1L LR  MDM Moderate  Assessment and Plan  #HG Flare of symptoms in setting of tapering down regimen, will resume full regimen and hydrate, hopefully will improve enough to go home. Will also check labs to see if significant electrolyte abnormalities are present.   #Elevated BP #cHTN Initial BP 147/101 with multiple elevated since in setting of wretching/vomiting. No prior elevated BP's in pregnancy tab however review of  flowsheets shows has had multiple mild range BP's during prior admissions. This most likely represents diagnosis of cHTN in pregnancy and not new onset PIH, but will check labs to be sure.   Signed out to [employer] Chemical[employer]rin Geneve Kimpel at change of shift.   Mary SellaMatthew M [facility] 07/24/19 8:07 AM    Potassium down to 2.3. Ordered 2nd bag of fluids, banana bag with 40 meq of KCL added in.  Will d/c home with Rx micro K  x 5 days  Pt not observed vomiting in MAU & tolerated water. Discussed restarting previous treatment regimen; pt is agreeable & reports that she does not need refills at this time.   Weight down 14 lbs since office visit on 12/8. Pt states prior to this week has been eating normally. Discussed with Dr. Rip Harbour. Recommend supplementing meals with ensure or carnation breakfast. Pt to have weight check at next visit & consider nutrition consult if not gaining.   Baseline HTN labs obtained. Discussed diagnosis of chronic hypertension with patient. Will start on baby ASA. Added to problem list.     A: 1. Hyperemesis gravidarum with metabolic disturbance, antepartum   2. Hypokalemia   3. Chronic hypertension in obstetric context in second trimester    P: Discharge home Rx baby ASA & micro K Restart previous HEG meds Discussed reasons to return to [facility] Msg to [facility] to change upcoming visit to in person   Jorje Guild, NP

## 2019-07-24 NOTE — Progress Notes (Signed)
CRITICAL VALUE ALERT  Critical Value:  Potassium 2.3  Date & Time Notied:  07/24/19 @ 0839  Provider Notified: Yes  Orders Received/Actions taken: Potassium replacement

## 2019-07-24 NOTE — Progress Notes (Signed)
Pt was in the middle of throwing up when this was taken

## 2019-08-09 ENCOUNTER — Telehealth: Admitting: Obstetrics and Gynecology

## 2019-08-10 ENCOUNTER — Encounter: Admitting: Obstetrics & Gynecology

## 2019-08-14 ENCOUNTER — Ambulatory Visit (HOSPITAL_COMMUNITY)

## 2019-12-21 IMAGING — US US ABDOMEN LIMITED
1 series · 15 of 25 positions shown · non-contrast
Comparison: None.

CLINICAL DATA: Eleven weeks pregnant nausea and vomiting

EXAM:
ULTRASOUND ABDOMEN LIMITED RIGHT UPPER QUADRANT

[Series 1: us abdomen limited · 15 of 47 slices shown]
[im 1/47]
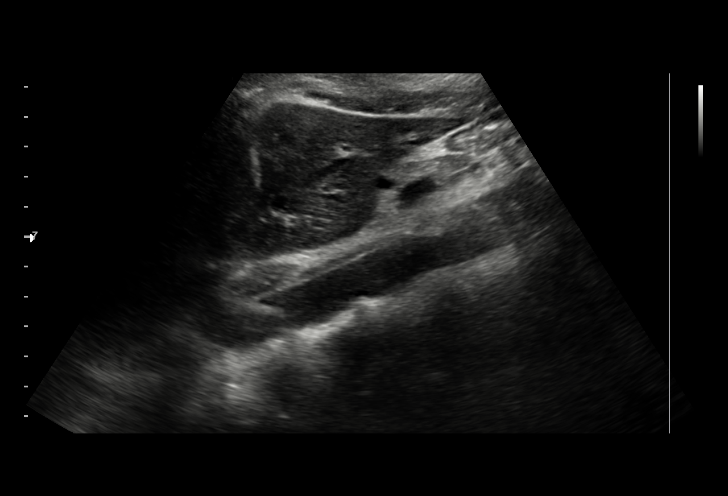
[im 4/47]
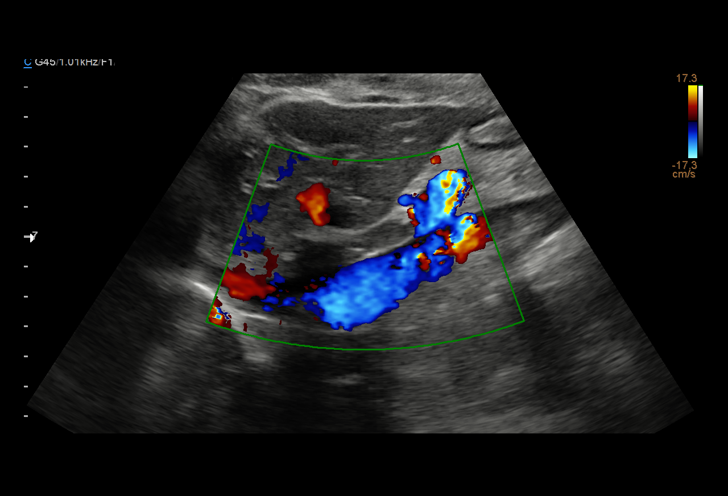
[im 8/47]
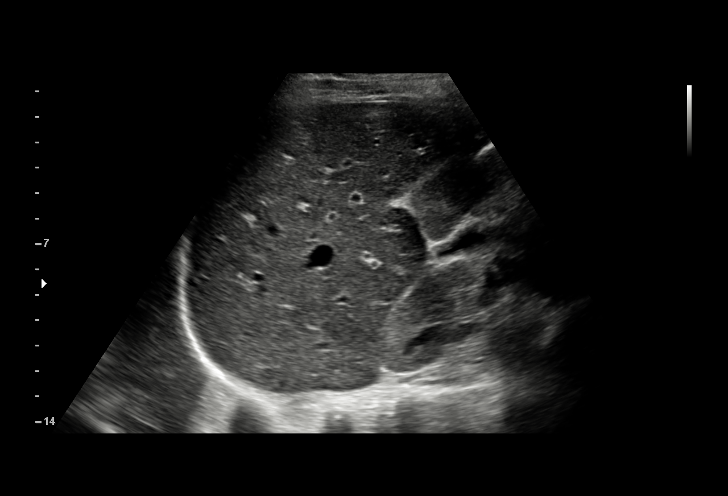
[im 10/47]
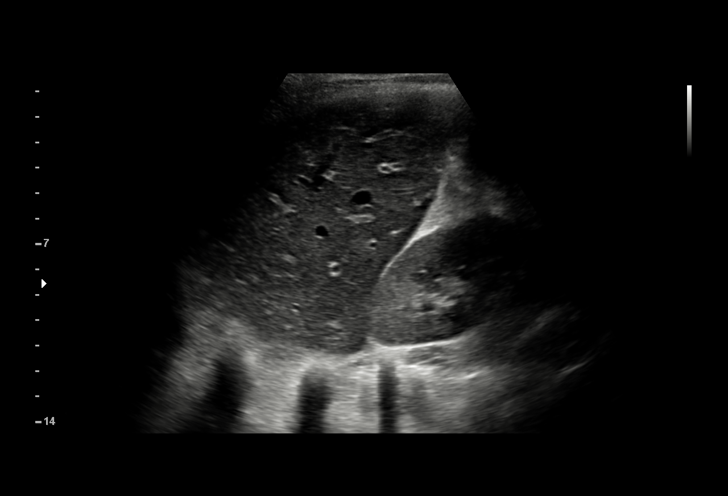
[im 14/47]
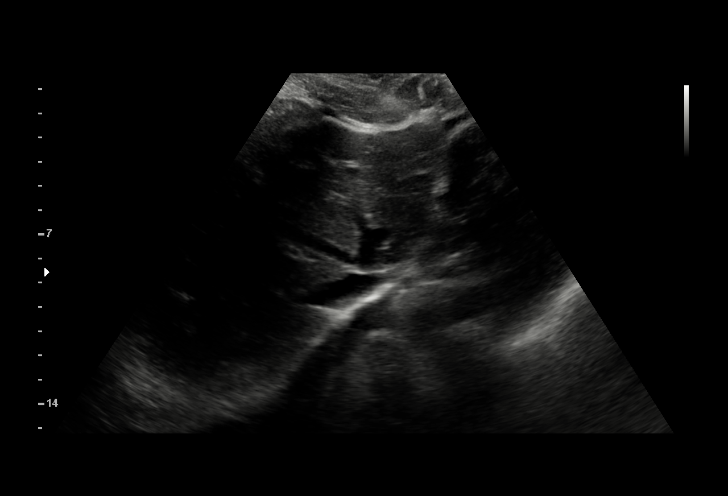
[im 18/47]
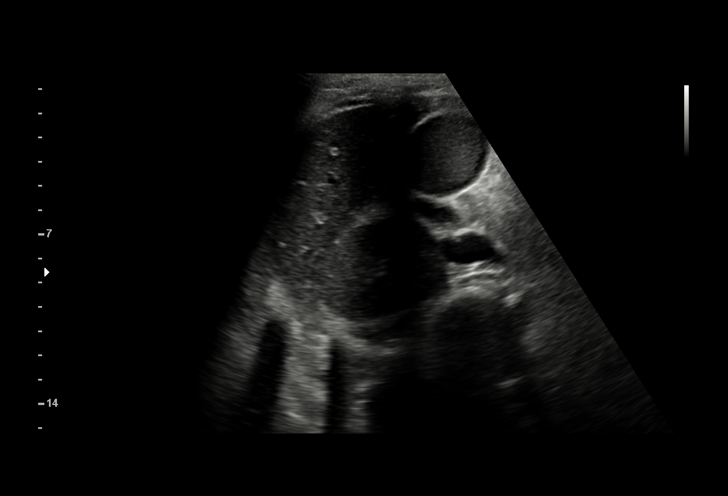
[im 20/47]
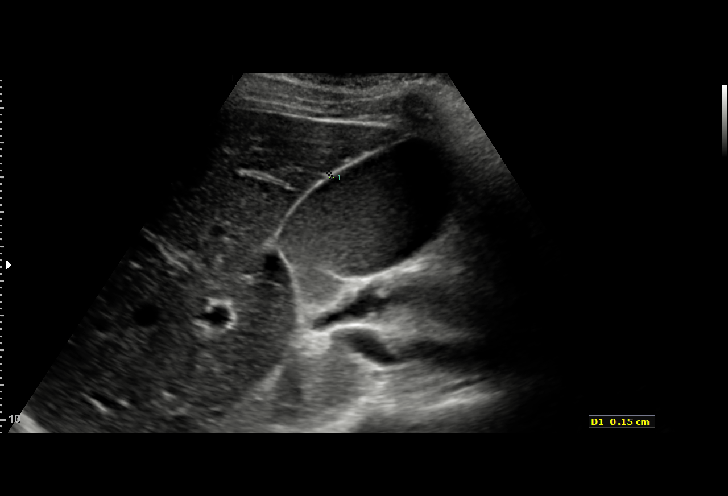
[im 24/47]
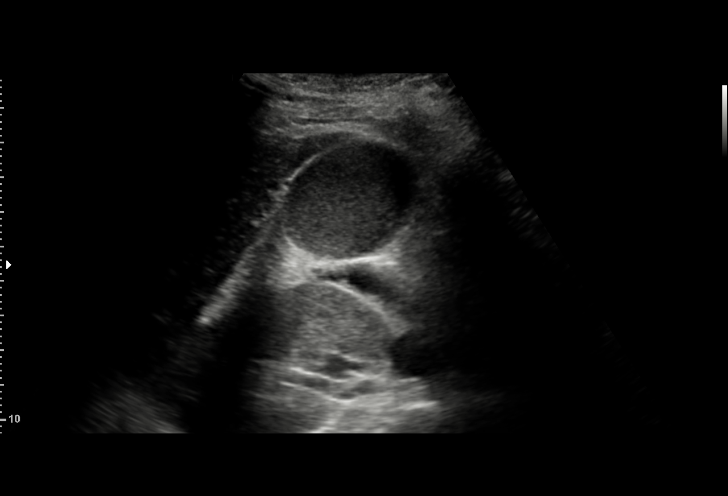
[im 27/47]
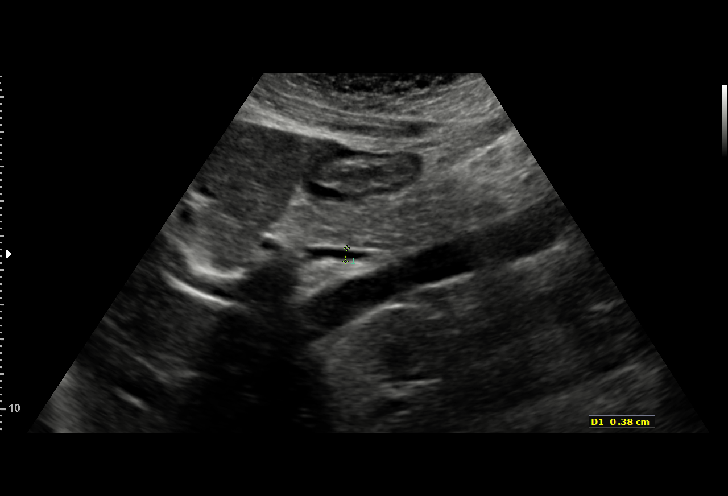
[im 29/47]
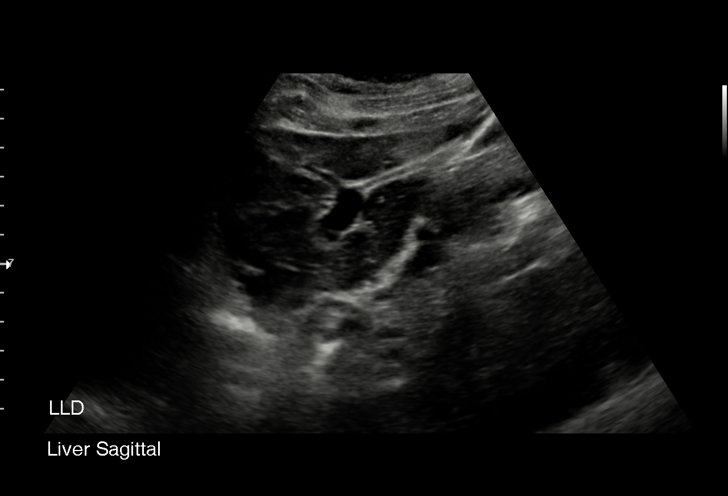
[im 33/47]
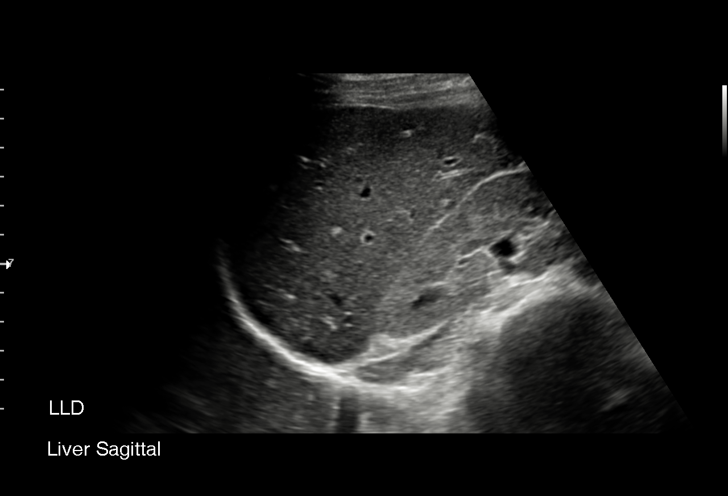
[im 37/47]
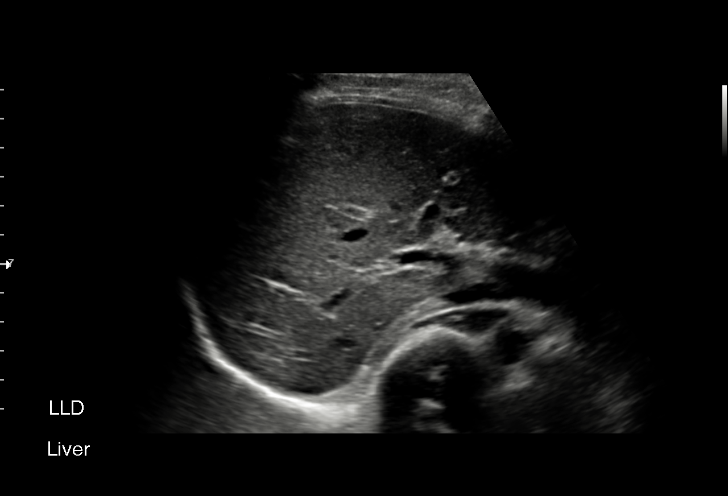
[im 39/47]
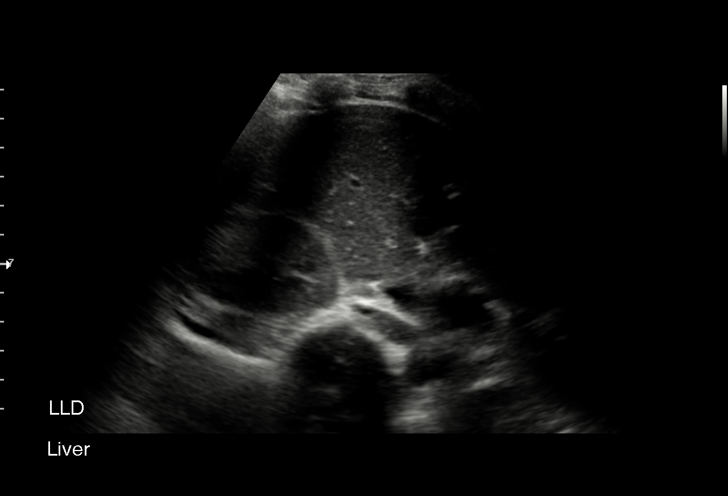
[im 43/47]
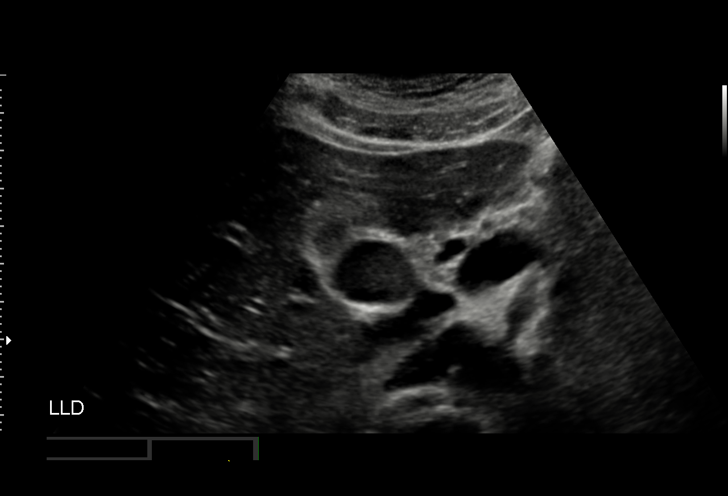
[im 47/47]
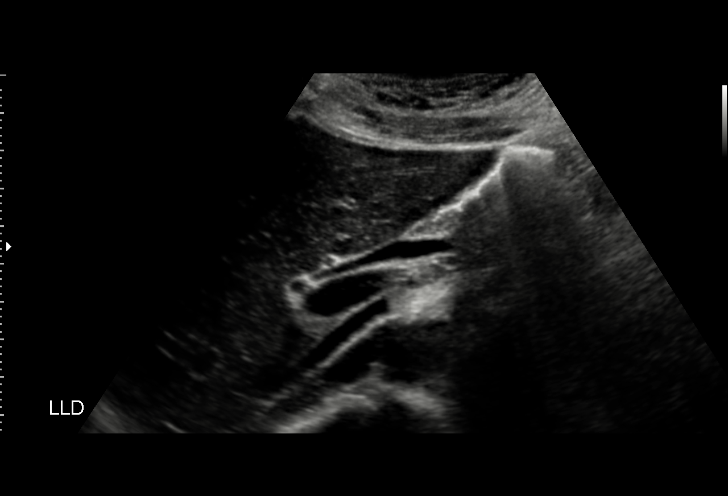

[15 of 25 positions shown; findings below may reference images not displayed]

FINDINGS: Gallbladder:

Filled with sludge. No shadowing stone. Normal wall thickness.
Negative sonographic Murphy

Common bile duct:

Diameter: 5.4 mm

Liver:

No focal lesion identified. Within normal limits in parenchymal
echogenicity. Portal vein is patent on color Doppler imaging with
normal direction of blood flow towards the liver.

Other: None.
IMPRESSION: 1. Gallbladder sludge without acute inflammatory change by
sonography. No biliary dilatation
2. Ultrasound appearance of the liver is within normal limits

## 2020-01-18 IMAGING — US US ABDOMEN LIMITED
1 series · 15 of 25 positions shown · non-contrast
Comparison: 05/23/2019

CLINICAL DATA: Nausea and vomiting

EXAM:
ULTRASOUND ABDOMEN LIMITED RIGHT UPPER QUADRANT

[Series 1: us abdomen limited · 15 of 56 slices shown]
[im 1/56]
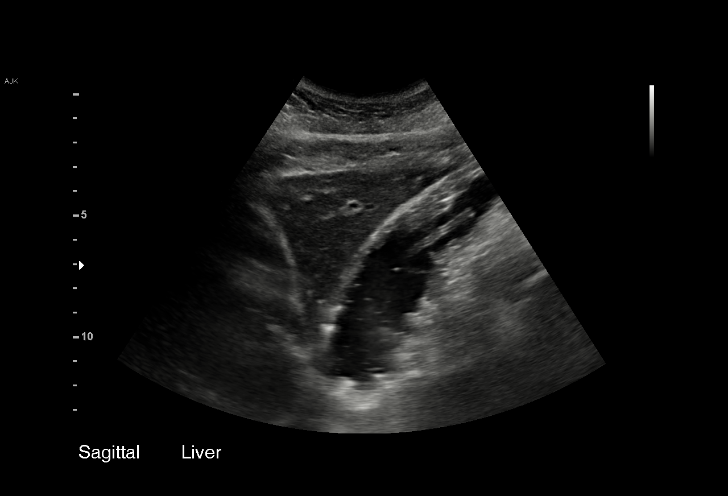
[im 5/56]
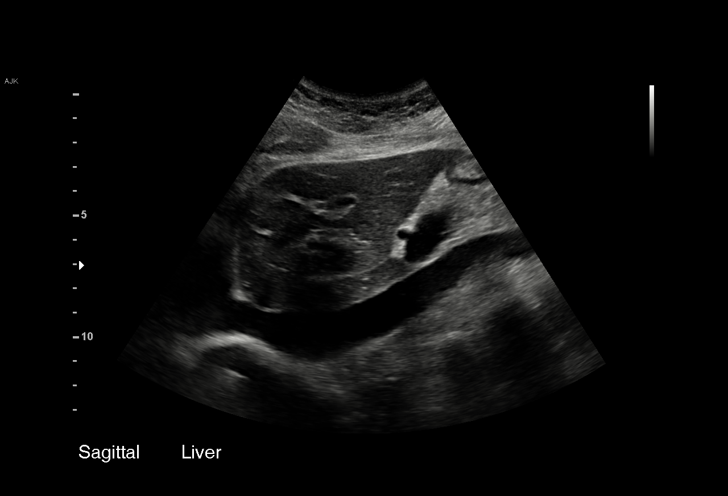
[im 10/56]
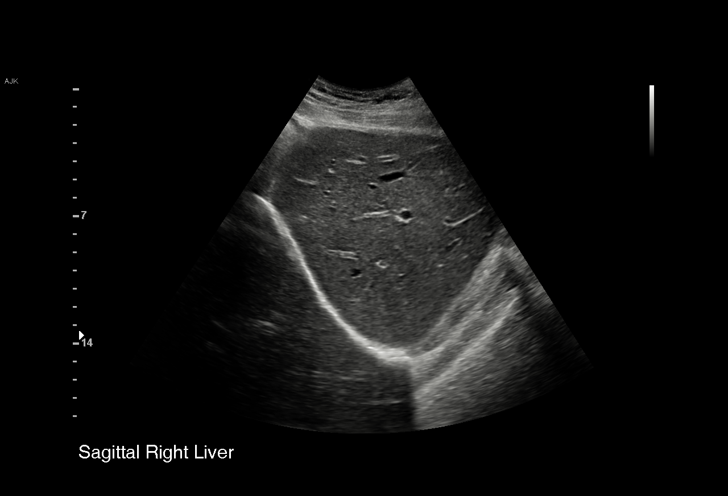
[im 12/56]
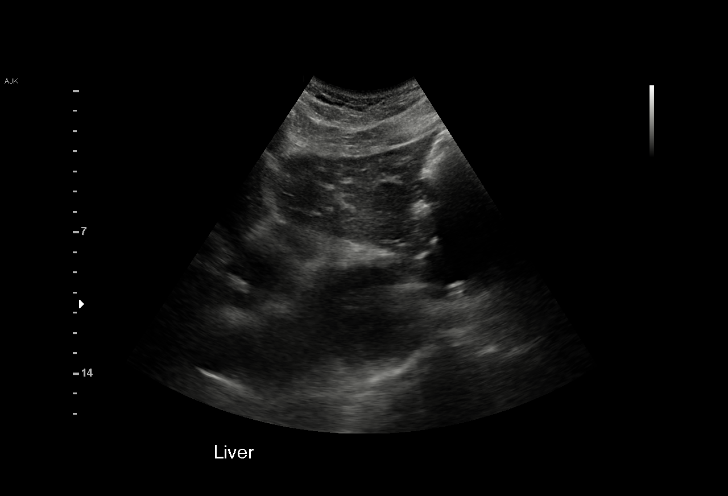
[im 17/56]
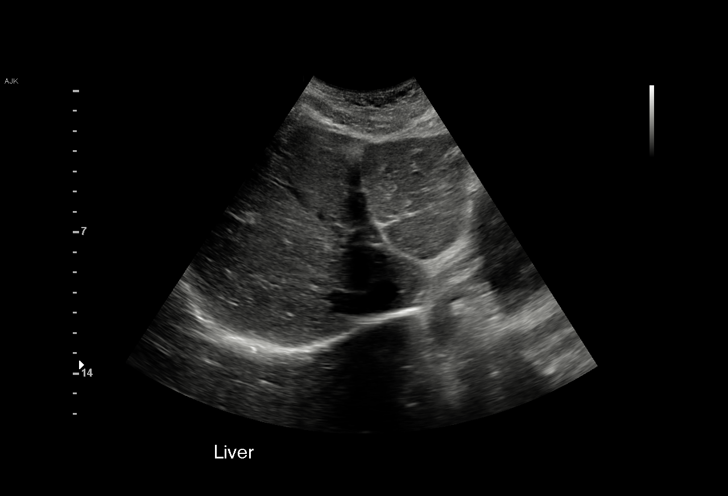
[im 21/56]
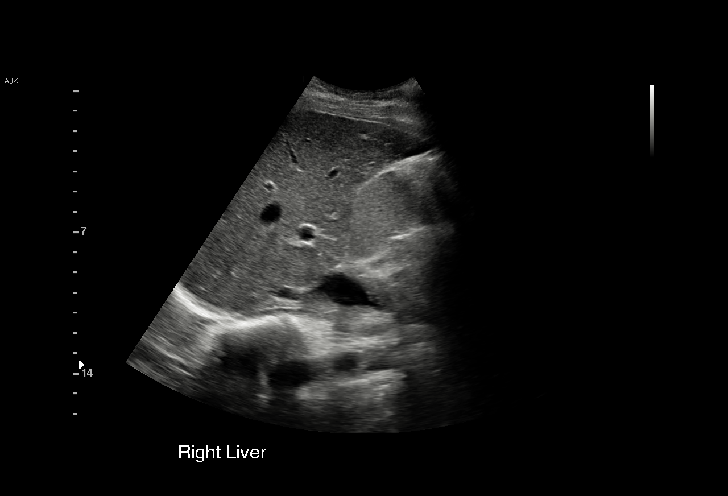
[im 23/56]
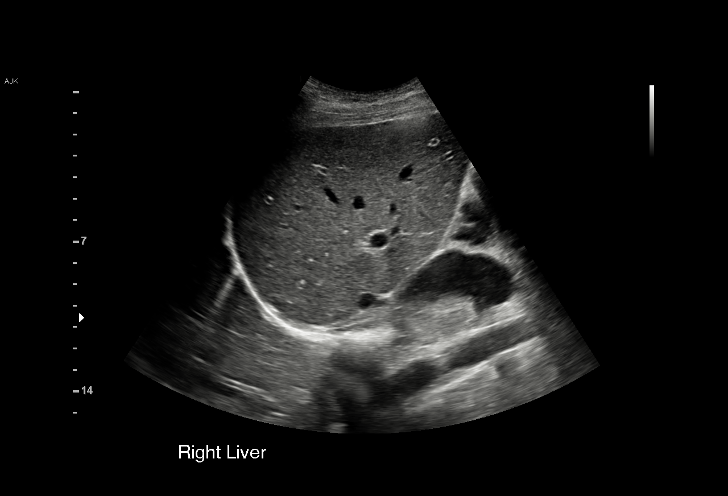
[im 28/56]
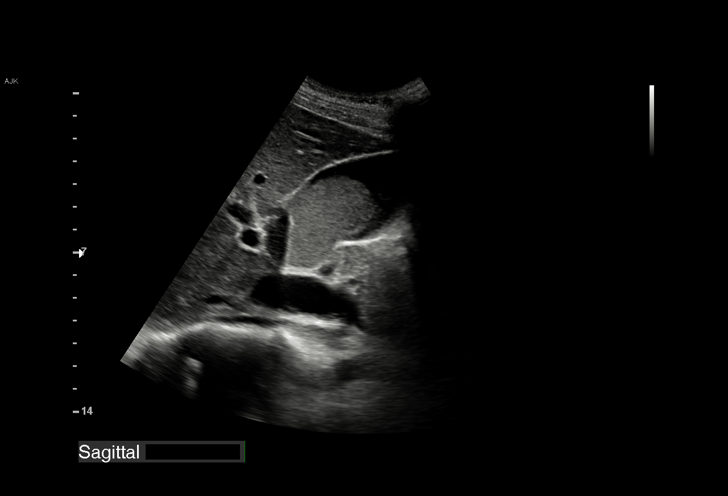
[im 33/56]
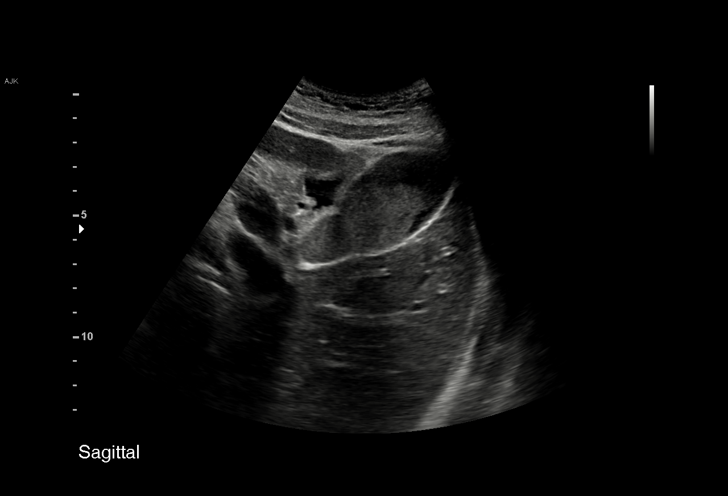
[im 35/56]
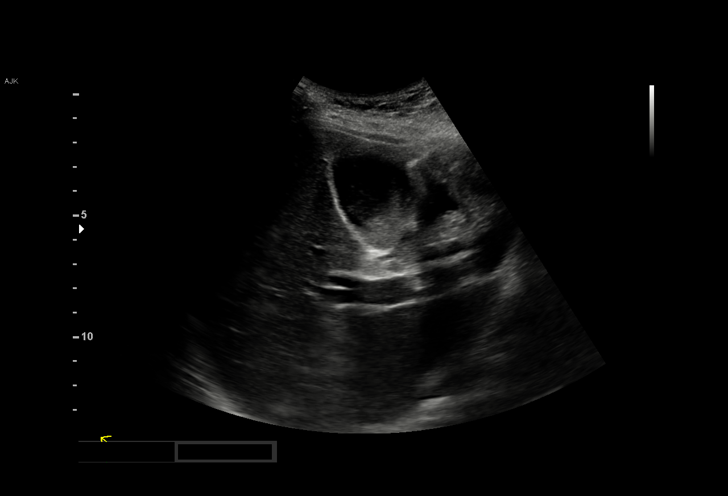
[im 39/56]
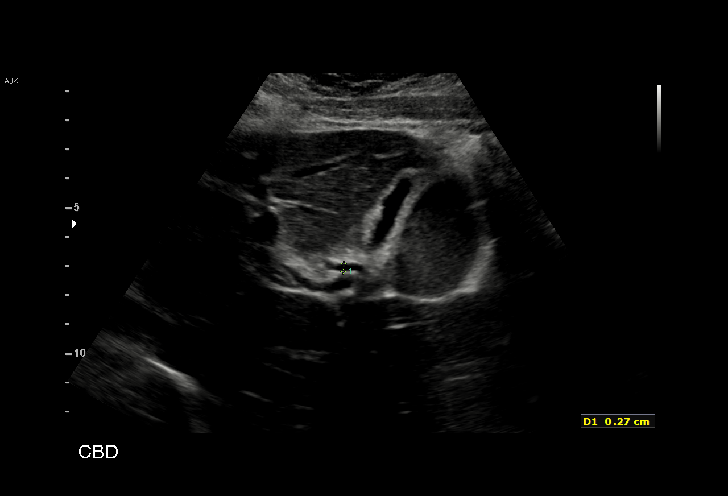
[im 44/56]
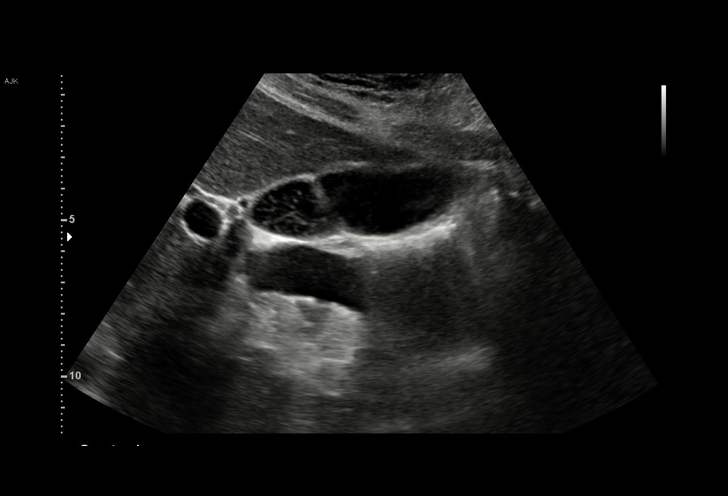
[im 46/56]
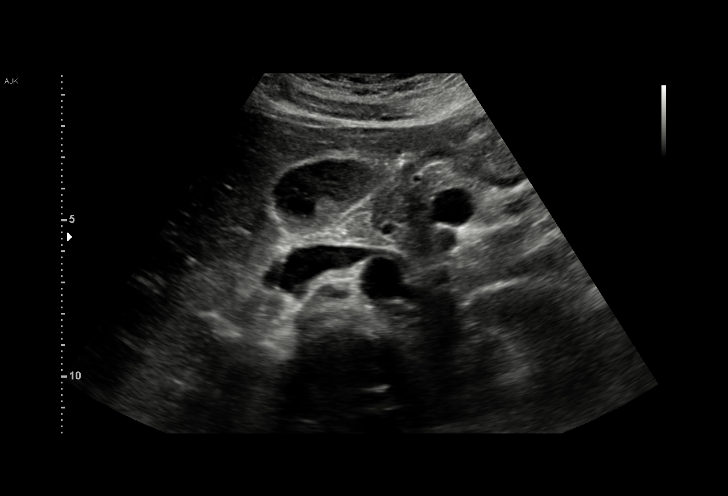
[im 51/56]
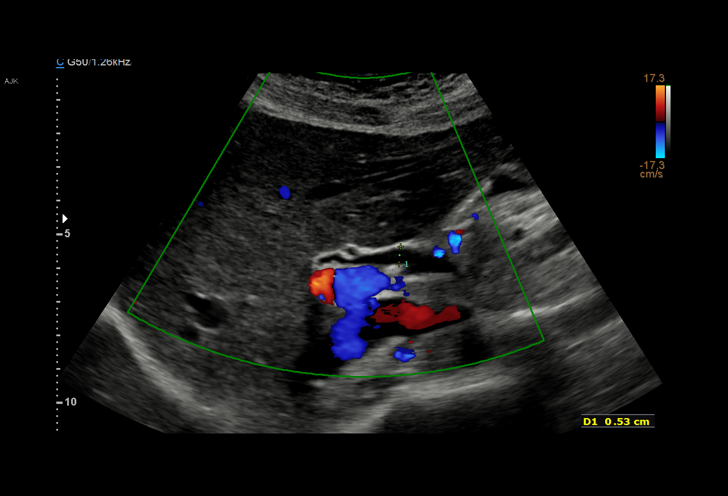
[im 56/56]
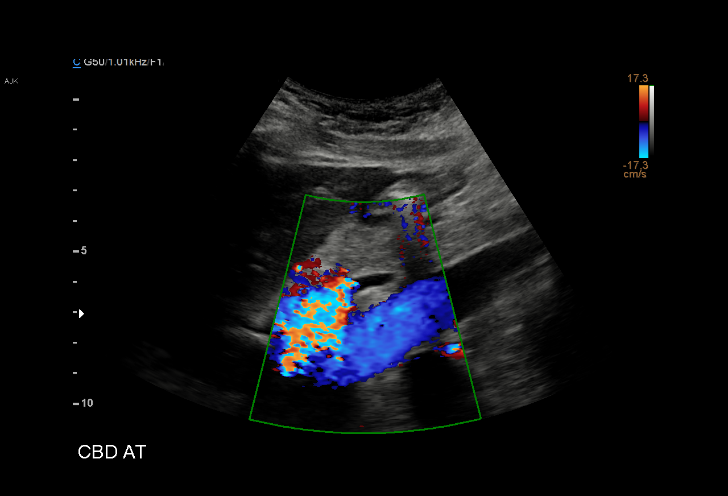

[15 of 25 positions shown; findings below may reference images not displayed]

FINDINGS: Gallbladder:

Hypoechoic intraluminal gallbladder sludge noted. no definite
gallstones or Murphy's sign elicited. No pericholecystic fluid. Mild
wall thickening measuring up to 3-5 mm anteriorly.

Common bile duct:

Diameter: 5.3 mm

Liver:

Normal echogenicity. No focal abnormality. Mild intrahepatic biliary
prominence as well throughout the liver. Portal vein is patent on
color Doppler imaging with normal direction of blood flow towards
the liver.

Other: No free fluid or ascites
IMPRESSION: Similar gallbladder sludge without definite signs of acute
cholecystitis. No gallstones visualized.

Mild intrahepatic biliary prominence remains nonspecific. Correlate
with LFTs. If there are laboratory abnormalities, consider follow-up
MRI/MRCP.

No focal hepatic abnormality.
